# Patient Record
Sex: Female | Born: 1975 | Race: White | Hispanic: No | Marital: Married | Smoking: Current every day smoker
Health system: Southern US, Community
[De-identification: ages and names within clinical notes are randomized; demographics above are authoritative.]

## PROBLEM LIST (undated history)

## (undated) DIAGNOSIS — R51 Headache: Secondary | ICD-10-CM

## (undated) DIAGNOSIS — R569 Unspecified convulsions: Secondary | ICD-10-CM

## (undated) HISTORY — PX: BACK SURGERY: SHX140

## (undated) HISTORY — DX: Unspecified convulsions: R56.9

## (undated) HISTORY — PX: CHOLECYSTECTOMY: SHX55

## (undated) HISTORY — DX: Headache: R51

## (undated) HISTORY — PX: TUBAL LIGATION: SHX77

---

## 2009-06-11 ENCOUNTER — Emergency Department (HOSPITAL_COMMUNITY): Admission: EM | Admit: 2009-06-11 | Discharge: 2009-06-11 | Payer: Self-pay | Admitting: Emergency Medicine

## 2013-12-23 ENCOUNTER — Encounter (HOSPITAL_COMMUNITY): Payer: Self-pay | Admitting: Emergency Medicine

## 2013-12-23 ENCOUNTER — Emergency Department (HOSPITAL_COMMUNITY)
Admission: EM | Admit: 2013-12-23 | Discharge: 2013-12-23 | Disposition: A | Discharge status: Home / Self Care | Payer: Medicaid Other | Attending: Emergency Medicine | Admitting: Emergency Medicine

## 2013-12-23 ENCOUNTER — Emergency Department (HOSPITAL_COMMUNITY): Payer: Medicaid Other

## 2013-12-23 DIAGNOSIS — R51 Headache: Secondary | ICD-10-CM | POA: Insufficient documentation

## 2013-12-23 DIAGNOSIS — Z9889 Other specified postprocedural states: Secondary | ICD-10-CM | POA: Diagnosis not present

## 2013-12-23 DIAGNOSIS — R1013 Epigastric pain: Secondary | ICD-10-CM | POA: Insufficient documentation

## 2013-12-23 DIAGNOSIS — Z9851 Tubal ligation status: Secondary | ICD-10-CM | POA: Diagnosis not present

## 2013-12-23 DIAGNOSIS — F17200 Nicotine dependence, unspecified, uncomplicated: Secondary | ICD-10-CM | POA: Diagnosis not present

## 2013-12-23 DIAGNOSIS — Z3202 Encounter for pregnancy test, result negative: Secondary | ICD-10-CM | POA: Insufficient documentation

## 2013-12-23 DIAGNOSIS — R079 Chest pain, unspecified: Secondary | ICD-10-CM | POA: Insufficient documentation

## 2013-12-23 LAB — HEPATIC FUNCTION PANEL
ALBUMIN: 4.4 g/dL (ref 3.5–5.2)
ALT: 20 U/L (ref 0–35)
AST: 19 U/L (ref 0–37)
Alkaline Phosphatase: 86 U/L (ref 39–117)
BILIRUBIN TOTAL: 0.3 mg/dL (ref 0.3–1.2)
Total Protein: 8.6 g/dL — ABNORMAL HIGH (ref 6.0–8.3)

## 2013-12-23 LAB — BASIC METABOLIC PANEL
Anion gap: 16 — ABNORMAL HIGH (ref 5–15)
BUN: 7 mg/dL (ref 6–23)
CHLORIDE: 104 meq/L (ref 96–112)
CO2: 22 meq/L (ref 19–32)
CREATININE: 0.71 mg/dL (ref 0.50–1.10)
Calcium: 9.6 mg/dL (ref 8.4–10.5)
GFR calc non Af Amer: >90 mL/min (ref >90)
GLUCOSE: 87 mg/dL (ref 70–99)
POTASSIUM: 4.3 meq/L (ref 3.7–5.3)
Sodium: 142 mEq/L (ref 137–147)

## 2013-12-23 LAB — CBC
HEMATOCRIT: 46.8 % — AB (ref 36.0–46.0)
HEMOGLOBIN: 16.3 g/dL — AB (ref 12.0–15.0)
MCH: 32.5 pg (ref 26.0–34.0)
MCHC: 34.8 g/dL (ref 30.0–36.0)
MCV: 93.2 fL (ref 78.0–100.0)
Platelets: 188 10*3/uL (ref 150–400)
RBC: 5.02 MIL/uL (ref 3.87–5.11)
RDW: 13.5 % (ref 11.5–15.5)
WBC: 11.5 10*3/uL — ABNORMAL HIGH (ref 4.0–10.5)

## 2013-12-23 LAB — POC URINE PREG, ED: Preg Test, Ur: NEGATIVE

## 2013-12-23 LAB — I-STAT TROPONIN, ED
TROPONIN I, POC: 0 ng/mL (ref 0.00–0.08)
TROPONIN I, POC: 0 ng/mL (ref 0.00–0.08)

## 2013-12-23 LAB — LIPASE, BLOOD: LIPASE: 31 U/L (ref 11–59)

## 2013-12-23 MED ORDER — KETOROLAC TROMETHAMINE 30 MG/ML IJ SOLN
30.0000 mg | Freq: Once | INTRAMUSCULAR | Status: AC
  Administered 2013-12-23: 30 mg via INTRAMUSCULAR
  Filled 2013-12-23: qty 1

## 2013-12-23 MED ORDER — HYDROCODONE-ACETAMINOPHEN 5-325 MG PO TABS: 1.0000 | ORAL_TABLET | Freq: Four times a day (QID) | ORAL | Status: DC | PRN

## 2013-12-23 MED ORDER — IBUPROFEN 800 MG PO TABS: 800.0000 mg | ORAL_TABLET | Freq: Three times a day (TID) | ORAL | Status: DC

## 2013-12-23 MED ORDER — IBUPROFEN 800 MG PO TABS
800.0000 mg | ORAL_TABLET | Freq: Once | ORAL | Status: AC
  Administered 2013-12-23: 800 mg via ORAL
  Filled 2013-12-23: qty 1

## 2013-12-23 NOTE — ED Notes (Signed)
Asked pt for a urine sample but pt states she is unable to void at this time.

## 2013-12-23 NOTE — ED Notes (Signed)
Spoke to lab about BMP delay; reports machine is getting a QC and will then be ran

## 2013-12-23 NOTE — ED Notes (Signed)
Pt is here with chest pain that radiates to left arm and neck for the last week.  Pt reports headache that just started today.

## 2013-12-23 NOTE — ED Notes (Signed)
Discharge instructions given voiced understanding

## 2013-12-23 NOTE — Discharge Instructions (Signed)

## 2013-12-23 NOTE — ED Provider Notes (Signed)
CSN: [id number]     Arrival date & time 12/23/13  1624 History   First MD Initiated Contact with Patient 12/23/13 1709     Chief Complaint  Patient presents with  . Chest Pain  . Headache     (Consider location/radiation/quality/duration/timing/severity/associated sxs/prior Treatment) HPI Comments: Patient presents to the emergency department with chief complaint of chest pain times 1 to 2 weeks. She states the symptoms are intermittent in nature. She denies any associated shortness of breath, diaphoresis, nausea, or vomiting. She has not tried taking anything to alleviate her symptoms. She denies any cardiac history. Cardiac risks factors are smoking only.  No history of PE or DVT, no recent surgery, no hemoptysis, no exogenous estrogen use, no unilateral leg swelling, no recent long travel. She states that when she presses on her abdomen in the upper left quadrant, this relieves the pain. She states that she is concerned about her heart because she has extensive family history of heart disease.   The history is provided by the patient. No language interpreter was used.    History reviewed. No pertinent past medical history. Past Surgical History  Procedure Laterality Date  . Cholecystectomy    . Tubal ligation     No family history on file. History  Substance Use Topics  . Smoking status: Current Every Day Smoker  . Smokeless tobacco: Not on file  . Alcohol Use: No   OB History   Grav Para Term Preterm Abortions TAB SAB Ect Mult Living                 Review of Systems  Constitutional: Negative for fever and chills.  Respiratory: Negative for shortness of breath.   Cardiovascular: Positive for chest pain.  Gastrointestinal: Negative for nausea, vomiting, diarrhea and constipation.  Genitourinary: Negative for dysuria.  All other systems reviewed and are negative.     Allergies  Review of patient's allergies indicates no known allergies.  Home Medications   Prior  to Admission medications   Not on File   BP 133/81  Pulse 91  Temp(Src) 98.4 F (36.9 C) (Oral)  Resp 18  SpO2 99%  LMP 11/23/2013 Physical Exam  Nursing note and vitals reviewed. Constitutional: She is oriented to person, place, and time. She appears well-developed and well-nourished.  HENT:  Head: Normocephalic and atraumatic.  Eyes: Conjunctivae and EOM are normal. Pupils are equal, round, and reactive to light.  Neck: Normal range of motion. Neck supple.  Cardiovascular: Normal rate and regular rhythm.  Exam reveals no gallop and no friction rub.   No murmur heard. Pulmonary/Chest: Effort normal and breath sounds normal. No respiratory distress. She has no wheezes. She has no rales. She exhibits no tenderness.  Clear to auscultation bilaterally  Abdominal: Soft. Bowel sounds are normal. She exhibits no distension and no mass. There is no tenderness. There is no rebound and no guarding.  Abdomen is moderately tender to palpation in the epigastrium, no other focal abdominal tenderness  Musculoskeletal: Normal range of motion. She exhibits no edema and no tenderness.  Neurological: She is alert and oriented to person, place, and time.  Skin: Skin is warm and dry.  Psychiatric: She has a normal mood and affect. Her behavior is normal. Judgment and thought content normal.    ED Course  Procedures (including critical care time) Results for orders placed during the hospital encounter of 12/23/13  CBC      Result Value Ref Range   WBC 11.5 (*) 4.0 -  10.5 K/uL   RBC 5.02  3.87 - 5.11 MIL/uL   Hemoglobin 16.3 (*) 12.0 - 15.0 g/dL   HCT 16.1 (*) 09.6 - 04.5 %   MCV 93.2  78.0 - 100.0 fL   MCH 32.5  26.0 - 34.0 pg   MCHC 34.8  30.0 - 36.0 g/dL   RDW 40.9  81.1 - 91.4 %   Platelets 188  150 - 400 K/uL  BASIC METABOLIC PANEL      Result Value Ref Range   Sodium 142  137 - 147 mEq/L   Potassium 4.3  3.7 - 5.3 mEq/L   Chloride 104  96 - 112 mEq/L   CO2 22  19 - 32 mEq/L    Glucose, Bld 87  70 - 99 mg/dL   BUN 7  6 - 23 mg/dL   Creatinine, Ser 7.82  0.50 - 1.10 mg/dL   Calcium 9.6  8.4 - 95.6 mg/dL   GFR calc non Af Amer >90  >90 mL/min   GFR calc Af Amer >90  >90 mL/min   Anion gap 16 (*) 5 - 15  LIPASE, BLOOD      Result Value Ref Range   Lipase 31  11 - 59 U/L  HEPATIC FUNCTION PANEL      Result Value Ref Range   Total Protein 8.6 (*) 6.0 - 8.3 g/dL   Albumin 4.4  3.5 - 5.2 g/dL   AST 19  0 - 37 U/L   ALT 20  0 - 35 U/L   Alkaline Phosphatase 86  39 - 117 U/L   Total Bilirubin 0.3  0.3 - 1.2 mg/dL   Bilirubin, Direct <2.1  0.0 - 0.3 mg/dL   Indirect Bilirubin NOT CALCULATED  0.3 - 0.9 mg/dL  I-STAT TROPOININ, ED      Result Value Ref Range   Troponin i, poc 0.00  0.00 - 0.08 ng/mL   Comment 3           POC URINE PREG, ED      Result Value Ref Range   Preg Test, Ur NEGATIVE  NEGATIVE  I-STAT TROPOININ, ED      Result Value Ref Range   Troponin i, poc 0.00  0.00 - 0.08 ng/mL   Comment 3            Dg Chest 2 View  12/23/2013   CLINICAL DATA:  Sharp chest pain and chest pressure.  EXAM: CHEST  2 VIEW  COMPARISON:  Chest x-ray 06/23/2010.  FINDINGS: Lung volumes are normal. No consolidative airspace disease. No pleural effusions. No pneumothorax. No pulmonary nodule or mass noted. Pulmonary vasculature and the cardiomediastinal silhouette are within normal limits. Azygos lobe (normal anatomical variant) incidentally noted. Surgical clips project over the right upper quadrant of the abdomen, likely from prior cholecystectomy.  IMPRESSION: No radiographic evidence of acute cardiopulmonary disease.   Electronically Signed   By: Trudie Reed M.D.   On: 12/23/2013 17:09     Imaging Review Dg Chest 2 View  12/23/2013   CLINICAL DATA:  Sharp chest pain and chest pressure.  EXAM: CHEST  2 VIEW  COMPARISON:  Chest x-ray 06/23/2010.  FINDINGS: Lung volumes are normal. No consolidative airspace disease. No pleural effusions. No pneumothorax. No pulmonary  nodule or mass noted. Pulmonary vasculature and the cardiomediastinal silhouette are within normal limits. Azygos lobe (normal anatomical variant) incidentally noted. Surgical clips project over the right upper quadrant of the abdomen, likely from prior cholecystectomy.  IMPRESSION:  No radiographic evidence of acute cardiopulmonary disease.   Electronically Signed   By: Trudie Reedaniel  Entrikin M.D.   On: 12/23/2013 17:09     EKG Interpretation   Date/Time:  Sunday December 23 2013 16:35:00 EDT Ventricular Rate:  71 PR Interval:  152 QRS Duration: 86 QT Interval:  418 QTC Calculation: 454 R Axis:   33 Text Interpretation:  Normal sinus rhythm Cannot rule out Inferior infarct  , age undetermined Abnormal ECG Similar to prior Confirmed by [facility]  MD,  BLAIR ([id number]) on 12/23/2013 6:09:12 PM      MDM   Final diagnoses:  Chest pain, unspecified chest pain type    Patient with chest pain x1-2 weeks.  Doubt ACS.  HEART score is 0.  PERC negative, doubt PE. No history of PE or DVT, no recent surgery, no hemoptysis, no exogenous estrogen use, no unilateral leg swelling, no recent long travel.  Pain has been intermittent.  It improves with palpation of the LUQ, and abdomen is TTP in the epigastric region.  Consider pancreatitis or GERD.  Will check abdominal labs and reassess.  Anticipate discharge to home.  7:01 PM Labs are reassuring.  Will check delta troponin.   8:44 PM Delta troponin is negative.  DC to home with NSAIDs and some pain medicine.  Recommend PCP follow-up.  Patient discussed with Dr. Gwendolyn GrantWalden, who agrees with the plan.  Return precautions given.  Filed Vitals:   12/23/13 1915  BP: 128/82  Pulse: 89  Temp:   Resp: [address]      Helma Argyle, PA-C 12/23/13 2046

## 2013-12-23 NOTE — ED Notes (Signed)
Rob, PA at bedside. 

## 2013-12-24 NOTE — ED Provider Notes (Signed)
Medical screening examination/treatment/procedure(s) were performed by non-physician practitioner and as supervising physician I was immediately available for consultation/collaboration.   EKG Interpretation   Date/Time:  Sunday December 23 2013 16:35:00 EDT Ventricular Rate:  71 PR Interval:  152 QRS Duration: 86 QT Interval:  418 QTC Calculation: 454 R Axis:   33 Text Interpretation:  Normal sinus rhythm Cannot rule out Inferior infarct  , age undetermined Abnormal ECG Similar to prior Confirmed by Gwendolyn GrantWALDEN  MD,  Ryin Schillo ([id number]) on 12/23/2013 6:09:12 PM        Martha HaitWilliam Berthel Bagnall, MD 12/24/13 [phone]

## 2014-04-16 ENCOUNTER — Emergency Department (HOSPITAL_COMMUNITY): Payer: Medicaid Other

## 2014-04-16 ENCOUNTER — Inpatient Hospital Stay (HOSPITAL_COMMUNITY)
Admission: EM | Admit: 2014-04-16 | Discharge: 2014-04-18 | DRG: 101 | Disposition: A | Discharge status: Home / Self Care | Payer: Medicaid Other | Attending: Internal Medicine | Admitting: Internal Medicine

## 2014-04-16 ENCOUNTER — Encounter (HOSPITAL_COMMUNITY): Payer: Self-pay | Admitting: Emergency Medicine

## 2014-04-16 DIAGNOSIS — Z79899 Other long term (current) drug therapy: Secondary | ICD-10-CM

## 2014-04-16 DIAGNOSIS — E876 Hypokalemia: Secondary | ICD-10-CM

## 2014-04-16 DIAGNOSIS — R569 Unspecified convulsions: Principal | ICD-10-CM | POA: Diagnosis present

## 2014-04-16 DIAGNOSIS — E872 Acidosis, unspecified: Secondary | ICD-10-CM | POA: Diagnosis present

## 2014-04-16 DIAGNOSIS — F17210 Nicotine dependence, cigarettes, uncomplicated: Secondary | ICD-10-CM | POA: Diagnosis present

## 2014-04-16 DIAGNOSIS — Z9049 Acquired absence of other specified parts of digestive tract: Secondary | ICD-10-CM | POA: Diagnosis present

## 2014-04-16 DIAGNOSIS — R112 Nausea with vomiting, unspecified: Secondary | ICD-10-CM | POA: Diagnosis present

## 2014-04-16 DIAGNOSIS — F17200 Nicotine dependence, unspecified, uncomplicated: Secondary | ICD-10-CM | POA: Diagnosis present

## 2014-04-16 DIAGNOSIS — R4182 Altered mental status, unspecified: Secondary | ICD-10-CM

## 2014-04-16 DIAGNOSIS — W19XXXA Unspecified fall, initial encounter: Secondary | ICD-10-CM

## 2014-04-16 DIAGNOSIS — D72829 Elevated white blood cell count, unspecified: Secondary | ICD-10-CM | POA: Diagnosis present

## 2014-04-16 LAB — CBC WITH DIFFERENTIAL/PLATELET
Basophils Absolute: 0.1 10*3/uL (ref 0.0–0.1)
Basophils Relative: 0 % (ref 0–1)
Eosinophils Absolute: 0.1 10*3/uL (ref 0.0–0.7)
Eosinophils Relative: 1 % (ref 0–5)
HCT: 44.9 % (ref 36.0–46.0)
Hemoglobin: 15.5 g/dL — ABNORMAL HIGH (ref 12.0–15.0)
Lymphocytes Relative: 28 % (ref 12–46)
Lymphs Abs: 5.8 10*3/uL — ABNORMAL HIGH (ref 0.7–4.0)
MCH: 31.8 pg (ref 26.0–34.0)
MCHC: 34.5 g/dL (ref 30.0–36.0)
MCV: 92 fL (ref 78.0–100.0)
Monocytes Absolute: 1.3 10*3/uL — ABNORMAL HIGH (ref 0.1–1.0)
Monocytes Relative: 6 % (ref 3–12)
Neutro Abs: 13.3 10*3/uL — ABNORMAL HIGH (ref 1.7–7.7)
Neutrophils Relative %: 65 % (ref 43–77)
Platelets: 236 10*3/uL (ref 150–400)
RBC: 4.88 MIL/uL (ref 3.87–5.11)
RDW: 12.8 % (ref 11.5–15.5)
WBC: 20.5 10*3/uL — ABNORMAL HIGH (ref 4.0–10.5)

## 2014-04-16 LAB — URINALYSIS, ROUTINE W REFLEX MICROSCOPIC
Bilirubin Urine: NEGATIVE
GLUCOSE, UA: NEGATIVE mg/dL
KETONES UR: NEGATIVE mg/dL
LEUKOCYTES UA: NEGATIVE
Nitrite: NEGATIVE
Protein, ur: 30 mg/dL — AB
SPECIFIC GRAVITY, URINE: 1.025 (ref 1.005–1.030)
Urobilinogen, UA: 0.2 mg/dL (ref 0.0–1.0)
pH: 5.5 (ref 5.0–8.0)

## 2014-04-16 LAB — CBG MONITORING, ED: GLUCOSE-CAPILLARY: 140 mg/dL — AB (ref 70–99)

## 2014-04-16 LAB — COMPREHENSIVE METABOLIC PANEL
ALT: 16 U/L (ref 0–35)
ANION GAP: 31 — AB (ref 5–15)
AST: 17 U/L (ref 0–37)
Albumin: 4.2 g/dL (ref 3.5–5.2)
Alkaline Phosphatase: 73 U/L (ref 39–117)
BILIRUBIN TOTAL: 0.4 mg/dL (ref 0.3–1.2)
BUN: 5 mg/dL — AB (ref 6–23)
CALCIUM: 9.3 mg/dL (ref 8.4–10.5)
CO2: 7 meq/L — AB (ref 19–32)
CREATININE: 0.86 mg/dL (ref 0.50–1.10)
Chloride: 101 mEq/L (ref 96–112)
GFR, EST NON AFRICAN AMERICAN: 85 mL/min — AB (ref >90)
GLUCOSE: 136 mg/dL — AB (ref 70–99)
Potassium: 3.3 mEq/L — ABNORMAL LOW (ref 3.7–5.3)
Sodium: 139 mEq/L (ref 137–147)
Total Protein: 8.4 g/dL — ABNORMAL HIGH (ref 6.0–8.3)

## 2014-04-16 LAB — I-STAT BETA HCG BLOOD, ED (MC, WL, AP ONLY)

## 2014-04-16 LAB — URINE MICROSCOPIC-ADD ON

## 2014-04-16 LAB — ETHANOL: Alcohol, Ethyl (B): <11 mg/dL (ref 0–11)

## 2014-04-16 LAB — RAPID URINE DRUG SCREEN, HOSP PERFORMED
Amphetamines: NOT DETECTED
BARBITURATES: NOT DETECTED
Benzodiazepines: NOT DETECTED
Cocaine: NOT DETECTED
OPIATES: NOT DETECTED
TETRAHYDROCANNABINOL: NOT DETECTED

## 2014-04-16 LAB — I-STAT CG4 LACTIC ACID, ED: LACTIC ACID, VENOUS: 15.47 mmol/L — AB (ref 0.5–2.2)

## 2014-04-16 MED ORDER — LORAZEPAM 2 MG/ML IJ SOLN
1.0000 mg | Freq: Once | INTRAMUSCULAR | Status: AC
  Administered 2014-04-16: 1 mg via INTRAVENOUS

## 2014-04-16 MED ORDER — SODIUM CHLORIDE 0.9 % IV BOLUS (SEPSIS)
1000.0000 mL | Freq: Once | INTRAVENOUS | Status: AC
  Administered 2014-04-16: 1000 mL via INTRAVENOUS

## 2014-04-16 MED ORDER — HALOPERIDOL LACTATE 5 MG/ML IJ SOLN
5.0000 mg | Freq: Once | INTRAMUSCULAR | Status: AC
  Administered 2014-04-16: 5 mg via INTRAVENOUS
  Filled 2014-04-16: qty 1

## 2014-04-16 NOTE — ED Notes (Signed)
Pt actively vomiting, responsive to pain.

## 2014-04-16 NOTE — ED Notes (Signed)
Pt returned from CT by staff.

## 2014-04-16 NOTE — Consult Note (Signed)
Consult Reason for Consult: seizure activity Referring Physician: Dr Ladona Ridgel [facility] ED  CC: seizure activity  HPI: Martha Chen is an 38 y.o. female presenting with new onset seizure x 2. Patient reports having headache earlier today for which she took a percocet. Shortly after noted to have LOC, hit head and had what appears to be tonic-clonic movement of bilateral UE. Lasted < 2 minutes. On EMS arrival was back to baseline, normal blood glucose. On arrival to ED had another episode described as shaking of bilateral upper and lower extremities. ED physician notes patient was non-verbal but would appear to regard/make eye contact during this episode. Lasted around 1-2 minutes. Given ativan after and then haldol for agitation.   Of note, patient reports being able to hear people during these episodes but was unable to respond. Her daughter notes the patient did briefly talk to her during the initial episode while having generalized movements. Patient reports being under increased stress recently.   Labs pertinent for WBC 20.5, lactic acid 15.47, CO2 of 7.   History mainly via ED physician and patients family.   History reviewed. No pertinent past medical history.  Past Surgical History  Procedure Laterality Date  . Cholecystectomy    . Tubal ligation      No family history on file.  Social History:  reports that she has been smoking.  She does not have any smokeless tobacco history on file. She reports that she does not drink alcohol or use illicit drugs.  No Known Allergies  Medications: Prior to Admission:  (Not in a hospital admission)  Head CT reviewed and was unremarkable.   ROS: Out of a complete 14 system review, the patient complains of only the following symptoms, and all other reviewed systems are negative. + fatigue, headache  Physical Examination: Blood pressure 115/63, pulse 79, resp. rate 24, SpO2 100 %.  Neurologic Examination Mental Status: Lethargic but easily  aroused, oriented to name, date, location, thought content appropriate.  Speech fluent without evidence of aphasia.  Able to follow 3 step commands without difficulty. Cranial Nerves: II: unable to visualize fundi due to pupil size, visual fields grossly normal, pupils equal, round, reactive to light and accommodation III,IV, VI: ptosis not present, extra-ocular motions intact bilaterally V,VII: smile symmetric, facial light touch sensation normal bilaterally VIII: hearing normal bilaterally IX,X: gag reflex present XI: unable to test due to cervical collar in place XII: tongue strength normal  Motor: Right : Upper extremity    Left:     Upper extremity 5/5 deltoid       5/5 deltoid 5/5 biceps      5/5 biceps  5/5 triceps      5/5 triceps 5/5 hand grip      5/5 hand grip  Lower extremity     Lower extremity 5/5 hip flexor      5/5 hip flexor 5/5 quadricep      5/5 quadriceps  5/5 hamstrings     5/5 hamstrings 5/5 plantar flexion       5/5 plantar flexion 5/5 plantar extension     5/5 plantar extension Tone and bulk:normal tone throughout; no atrophy noted Sensory: Pinprick and light touch intact throughout, bilaterally Deep Tendon Reflexes: 2+ and symmetric throughout, 1+ AJs bilaterally Plantars: Right: downgoing   Left: downgoing Cerebellar: normal finger-to-nose, normal rapid alternating movements and normal heel-to-shin test Gait: deferred due to C collar in place  Laboratory Studies:   Basic Metabolic Panel:  Recent Labs Lab 04/16/14 2058  NA 139  K 3.3*  CL 101  CO2 7*  GLUCOSE 136*  BUN 5*  CREATININE 0.86  CALCIUM 9.3    Liver Function Tests:  Recent Labs Lab 04/16/14 2058  AST 17  ALT 16  ALKPHOS 73  BILITOT 0.4  PROT 8.4*  ALBUMIN 4.2   No results for input(s): LIPASE, AMYLASE in the last 168 hours. No results for input(s): AMMONIA in the last 168 hours.  CBC:  Recent Labs Lab 04/16/14 2058  WBC 20.5*  NEUTROABS 13.3*  HGB 15.5*  HCT 44.9   MCV 92.0  PLT 236    Cardiac Enzymes: No results for input(s): CKTOTAL, CKMB, CKMBINDEX, TROPONINI in the last 168 hours.  BNP: Invalid input(s): POCBNP  CBG:  Recent Labs Lab 04/16/14 2046  GLUCAP 140*    Microbiology: No results found for this or any previous visit.  Coagulation Studies: No results for input(s): LABPROT, INR in the last 72 hours.  Urinalysis:  Recent Labs Lab 04/16/14 2130  COLORURINE YELLOW  LABSPEC 1.025  PHURINE 5.5  GLUCOSEU NEGATIVE  HGBUR MODERATE*  BILIRUBINUR NEGATIVE  KETONESUR NEGATIVE  PROTEINUR 30*  UROBILINOGEN 0.2  NITRITE NEGATIVE  LEUKOCYTESUR NEGATIVE    Lipid Panel:  No results found for: CHOL, TRIG, HDL, CHOLHDL, VLDL, LDLCALC  HgbA1C: No results found for: HGBA1C  Urine Drug Screen:     Component Value Date/Time   LABOPIA NONE DETECTED 04/16/2014 2130   COCAINSCRNUR NONE DETECTED 04/16/2014 2130   LABBENZ NONE DETECTED 04/16/2014 2130   AMPHETMU NONE DETECTED 04/16/2014 2130   THCU NONE DETECTED 04/16/2014 2130   LABBARB NONE DETECTED 04/16/2014 2130    Alcohol Level:  Recent Labs Lab 04/16/14 2058  ETH <11     Imaging: Ct Head Wo Contrast  04/16/2014   CLINICAL DATA:  Seizure-like activity. No history of seizures. Fall.  EXAM: CT HEAD WITHOUT CONTRAST  CT CERVICAL SPINE WITHOUT CONTRAST  TECHNIQUE: Multidetector CT imaging of the head and cervical spine was performed following the standard protocol without intravenous contrast. Multiplanar CT image reconstructions of the cervical spine were also generated.  COMPARISON:  CT head 10/12/2009. Cervical spine radiographs 06/11/2009.  FINDINGS: CT HEAD FINDINGS  Ventricles and sulci appear symmetrical. No mass effect or midline shift. No abnormal extra-axial fluid collections. Gray-white matter junctions are distinct. Basal cisterns are not effaced. No evidence of acute intracranial hemorrhage. No depressed skull fractures. Mucosal thickening in the left maxillary  antrum. Mastoid air cells are not opacified.  CT CERVICAL SPINE FINDINGS  Normal alignment of the cervical spine and facet joints. No vertebral compression deformities. Intervertebral disc space heights are preserved. Minimal degenerative endplate hypertrophic changes. Mild degenerative changes at C1 to. No prevertebral soft tissue swelling. C1-2 articulation appears intact. No focal bone lesion or bone destruction. Bone cortex and trabecular architecture appear intact. Soft tissues are unremarkable. Azygos lobe demonstrated in the right lung apex.  IMPRESSION: No acute intracranial abnormalities.  Normal alignment of the cervical spine. No displaced fractures identified.   Electronically Signed   By: Burman NievesWilliam  Stevens M.D.   On: 04/16/2014 21:37   Ct Cervical Spine Wo Contrast  04/16/2014   CLINICAL DATA:  Seizure-like activity. No history of seizures. Fall.  EXAM: CT HEAD WITHOUT CONTRAST  CT CERVICAL SPINE WITHOUT CONTRAST  TECHNIQUE: Multidetector CT imaging of the head and cervical spine was performed following the standard protocol without intravenous contrast. Multiplanar CT image reconstructions of the cervical spine were also generated.  COMPARISON:  CT head  10/12/2009. Cervical spine radiographs 06/11/2009.  FINDINGS: CT HEAD FINDINGS  Ventricles and sulci appear symmetrical. No mass effect or midline shift. No abnormal extra-axial fluid collections. Gray-white matter junctions are distinct. Basal cisterns are not effaced. No evidence of acute intracranial hemorrhage. No depressed skull fractures. Mucosal thickening in the left maxillary antrum. Mastoid air cells are not opacified.  CT CERVICAL SPINE FINDINGS  Normal alignment of the cervical spine and facet joints. No vertebral compression deformities. Intervertebral disc space heights are preserved. Minimal degenerative endplate hypertrophic changes. Mild degenerative changes at C1 to. No prevertebral soft tissue swelling. C1-2 articulation appears  intact. No focal bone lesion or bone destruction. Bone cortex and trabecular architecture appear intact. Soft tissues are unremarkable. Azygos lobe demonstrated in the right lung apex.  IMPRESSION: No acute intracranial abnormalities.  Normal alignment of the cervical spine. No displaced fractures identified.   Electronically Signed   By: Burman NievesWilliam  Stevens M.D.   On: 04/16/2014 21:37   Dg Chest Portable 1 View  04/16/2014   CLINICAL DATA:  Altered mental status. Patient lost consciousness today and fell to the ground. Smoker.  EXAM: PORTABLE CHEST - 1 VIEW  COMPARISON:  12/23/2013  FINDINGS: Shallow inspiration. The heart size and mediastinal contours are within normal limits. Both lungs are clear. The visualized skeletal structures are unremarkable.  IMPRESSION: No active disease.   Electronically Signed   By: Burman NievesWilliam  Stevens M.D.   On: 04/16/2014 22:13     Assessment/Plan:  38y/o woman presenting for episodes concerning for seizure activity. No prior seizure activity. Clinical description raises question of possible non-epileptic seizure but she does have markedly elevated WBC and lactic acid level. Will admit for observation and further workup. Will hold off on AED pending further workup.  -EEG -MRI brain  -seizure precautions -ativan prn for subsequent seizure   Elspeth Choeter Angelisse Riso, DO [facility] [phone]  If 7pm- 7am, please page neurology on call as listed in AMION. 04/16/2014, 11:30 PM

## 2014-04-16 NOTE — ED Notes (Signed)
Neuro at bedside.

## 2014-04-16 NOTE — ED Notes (Signed)
Elevated CG-4 of 15.47 reported to Dr. Blinda LeatherwoodPollina

## 2014-04-16 NOTE — ED Provider Notes (Signed)
CSN: [id number]     Arrival date & time 04/16/14  2030 History   First MD Initiated Contact with Patient 04/16/14 2053     Chief Complaint  Patient presents with  . Seizures     (Consider location/radiation/quality/duration/timing/severity/associated sxs/prior Treatment) HPI Comments: 38 y/o female with no known seizure history presenting with seizure like activity. Pt reported having HA earlier today to family and took percocet. She was then reported to have generalized shaking episode. On EMS arrival, she was AAO with normal CBG. She began having shaking episode on arrival to ED. She will not answer questions on arrival, is flailing all extremities, but will make eye contact.   The history is provided by the EMS personnel and a relative. No language interpreter was used.    History reviewed. No pertinent past medical history. Past Surgical History  Procedure Laterality Date  . Cholecystectomy    . Tubal ligation     No family history on file. History  Substance Use Topics  . Smoking status: Current Every Day Smoker  . Smokeless tobacco: Not on file  . Alcohol Use: No   OB History    No data available     Review of Systems  Unable to perform ROS: Mental status change  Neurological: Positive for seizures.      Allergies  Review of patient's allergies indicates no known allergies.  Home Medications   Prior to Admission medications   Medication Sig Start Date End Date Taking? Authorizing Provider  HYDROcodone-acetaminophen (NORCO/VICODIN) 5-325 MG per tablet Take 1 tablet by mouth every 6 (six) hours as needed. 12/23/13   Roxy Horsemanobert Browning, PA-C  ibuprofen (ADVIL,MOTRIN) 800 MG tablet Take 1 tablet (800 mg total) by mouth 3 (three) times daily. 12/23/13   Roxy Horsemanobert Browning, PA-C   BP 119/78 mmHg  Resp 32  SpO2 95% Physical Exam  Constitutional: She appears well-developed and well-nourished.  HENT:  Head: Normocephalic and atraumatic.  Mouth/Throat: Oropharynx is clear  and moist.  Eyes: EOM are normal. Pupils are equal, round, and reactive to light.  Cardiovascular: Regular rhythm and normal heart sounds.  Tachycardia present.   Pulmonary/Chest: Breath sounds normal.  Abdominal: Soft. She exhibits no distension.  Neurological: She is alert. She has normal strength. She is disoriented. No cranial nerve deficit or sensory deficit. GCS eye subscore is 4. GCS verbal subscore is 4. GCS motor subscore is 6.  Reflex Scores:      Patellar reflexes are 2+ on the right side and 2+ on the left side.      Achilles reflexes are 2+ on the right side and 2+ on the left side. Vitals reviewed.   ED Course  Procedures (including critical care time) Labs Review Labs Reviewed  CBC WITH DIFFERENTIAL - Abnormal; Notable for the following:    WBC 20.5 (*)    Hemoglobin 15.5 (*)    Neutro Abs 13.3 (*)    Lymphs Abs 5.8 (*)    Monocytes Absolute 1.3 (*)    All other components within normal limits  COMPREHENSIVE METABOLIC PANEL - Abnormal; Notable for the following:    Potassium 3.3 (*)    CO2 7 (*)    Glucose, Bld 136 (*)    BUN 5 (*)    Total Protein 8.4 (*)    GFR calc non Af Amer 85 (*)    Anion gap 31 (*)    All other components within normal limits  URINALYSIS, ROUTINE W REFLEX MICROSCOPIC - Abnormal; Notable for the following:  Hgb urine dipstick MODERATE (*)    Protein, ur 30 (*)    All other components within normal limits  URINE MICROSCOPIC-ADD ON - Abnormal; Notable for the following:    Casts HYALINE CASTS (*)    All other components within normal limits  COMPREHENSIVE METABOLIC PANEL - Abnormal; Notable for the following:    Potassium 3.4 (*)    CO2 17 (*)    Glucose, Bld 110 (*)    BUN 5 (*)    GFR calc non Af Amer 88 (*)    All other components within normal limits  CBC - Abnormal; Notable for the following:    WBC 19.9 (*)    All other components within normal limits  I-STAT CG4 LACTIC ACID, ED - Abnormal; Notable for the following:     Lactic Acid, Venous 15.47 (*)    All other components within normal limits  CBG MONITORING, ED - Abnormal; Notable for the following:    Glucose-Capillary 140 (*)    All other components within normal limits  MRSA PCR SCREENING  CULTURE, BLOOD (ROUTINE X 2)  CULTURE, BLOOD (ROUTINE X 2)  ETHANOL  URINE RAPID DRUG SCREEN (HOSP PERFORMED)  LIPASE, BLOOD  LACTIC ACID, PLASMA  PROTIME-INR  I-STAT BETA HCG BLOOD, ED ([facility], [facility], [facility] ONLY)    Imaging Review Ct Head Wo Contrast  04/16/2014   CLINICAL DATA:  Seizure-like activity. No history of seizures. Fall.  EXAM: CT HEAD WITHOUT CONTRAST  CT CERVICAL SPINE WITHOUT CONTRAST  TECHNIQUE: Multidetector CT imaging of the head and cervical spine was performed following the standard protocol without intravenous contrast. Multiplanar CT image reconstructions of the cervical spine were also generated.  COMPARISON:  CT head 10/12/2009. Cervical spine radiographs 06/11/2009.  FINDINGS: CT HEAD FINDINGS  Ventricles and sulci appear symmetrical. No mass effect or midline shift. No abnormal extra-axial fluid collections. Gray-white matter junctions are distinct. Basal cisterns are not effaced. No evidence of acute intracranial hemorrhage. No depressed skull fractures. Mucosal thickening in the left maxillary antrum. Mastoid air cells are not opacified.  CT CERVICAL SPINE FINDINGS  Normal alignment of the cervical spine and facet joints. No vertebral compression deformities. Intervertebral disc space heights are preserved. Minimal degenerative endplate hypertrophic changes. Mild degenerative changes at C1 to. No prevertebral soft tissue swelling. C1-2 articulation appears intact. No focal bone lesion or bone destruction. Bone cortex and trabecular architecture appear intact. Soft tissues are unremarkable. Azygos lobe demonstrated in the right lung apex.  IMPRESSION: No acute intracranial abnormalities.  Normal alignment of the cervical spine. No displaced fractures  identified.   Electronically Signed   By: Burman Nieves M.D.   On: 04/16/2014 21:37   Ct Cervical Spine Wo Contrast  04/16/2014   CLINICAL DATA:  Seizure-like activity. No history of seizures. Fall.  EXAM: CT HEAD WITHOUT CONTRAST  CT CERVICAL SPINE WITHOUT CONTRAST  TECHNIQUE: Multidetector CT imaging of the head and cervical spine was performed following the standard protocol without intravenous contrast. Multiplanar CT image reconstructions of the cervical spine were also generated.  COMPARISON:  CT head 10/12/2009. Cervical spine radiographs 06/11/2009.  FINDINGS: CT HEAD FINDINGS  Ventricles and sulci appear symmetrical. No mass effect or midline shift. No abnormal extra-axial fluid collections. Gray-white matter junctions are distinct. Basal cisterns are not effaced. No evidence of acute intracranial hemorrhage. No depressed skull fractures. Mucosal thickening in the left maxillary antrum. Mastoid air cells are not opacified.  CT CERVICAL SPINE FINDINGS  Normal alignment of the cervical spine  and facet joints. No vertebral compression deformities. Intervertebral disc space heights are preserved. Minimal degenerative endplate hypertrophic changes. Mild degenerative changes at C1 to. No prevertebral soft tissue swelling. C1-2 articulation appears intact. No focal bone lesion or bone destruction. Bone cortex and trabecular architecture appear intact. Soft tissues are unremarkable. Azygos lobe demonstrated in the right lung apex.  IMPRESSION: No acute intracranial abnormalities.  Normal alignment of the cervical spine. No displaced fractures identified.   Electronically Signed   By: Burman NievesWilliam  Stevens M.D.   On: 04/16/2014 21:37   Dg Chest Portable 1 View  04/16/2014   CLINICAL DATA:  Altered mental status. Patient lost consciousness today and fell to the ground. Smoker.  EXAM: PORTABLE CHEST - 1 VIEW  COMPARISON:  12/23/2013  FINDINGS: Shallow inspiration. The heart size and mediastinal contours are  within normal limits. Both lungs are clear. The visualized skeletal structures are unremarkable.  IMPRESSION: No active disease.   Electronically Signed   By: Burman NievesWilliam  Stevens M.D.   On: 04/16/2014 22:13     EKG Interpretation   Date/Time:  Tuesday April 16 2014 20:47:02 EST Ventricular Rate:  143 PR Interval:  112 QRS Duration: 95 QT Interval:  297 QTC Calculation: 458 R Axis:   -29 Text Interpretation:  Age not entered, assumed to be  38 years old for  purpose of ECG interpretation Sinus tachycardia Inferior infarct, old  Abnormal lateral Q waves Probable anteroseptal infarct, old No significant  change since last tracing Confirmed by [facility]LL[facility]  MD, CHRISTOPHER [phone]([id number]) on  04/16/2014 8:54:11 PM      MDM   Final diagnoses:  Fall  Altered mental status    38 y/o female with no significant reported PMH presenting with seizure activity. One episode at home, recurrent on arrival. CBG wnl. Rhythmic movement of arms then spread to include legs. Eye blinking, foaming at mouth, and urinary incontinence. 2 mg ativan given with resolution. Pt persistently agitated, given 5 mg haldol after EKG showed normal QTc to facilitate imaging. Protecting airway, no indication for intubation. CT head/cervical spine without acute abnormality. Pt alert, post ictal, able to answer simple questions on return. Denies recent infection. Did fall with episode. Labs remarkable for lactic acidosis 15, WBC 20, CO2 7. Negative UDS, Etoh. Neurology consulted with concern for new onset seizure vs nonepileptic convulsion. No further episodes in ED. Will admit to Hospitalist for MRI and EEG.     Abagail KitchensMegan Tavoris Brisk, MD 04/17/14 1028  Gilda Creasehristopher J. Pollina, MD 04/17/14 1535

## 2014-04-16 NOTE — ED Notes (Addendum)
Pt to ED via EMS for evaluation of seizure like activity witnessed by family- denies hx of seizures. Pt alert and oriented with EMS- upon arrival to ED pt began having seizure like activity- brought into trauma room- EDP at bedside.  Pt flailing all extremities, moaning, incontinent of urine.

## 2014-04-16 NOTE — ED Notes (Signed)
In and out performed for U/A with assistance from Ali LoweJenna Gage, Charity fundraiserN.

## 2014-04-17 ENCOUNTER — Inpatient Hospital Stay (HOSPITAL_COMMUNITY): Payer: Medicaid Other

## 2014-04-17 ENCOUNTER — Encounter (HOSPITAL_COMMUNITY): Payer: Self-pay | Admitting: Internal Medicine

## 2014-04-17 DIAGNOSIS — D72829 Elevated white blood cell count, unspecified: Secondary | ICD-10-CM | POA: Diagnosis present

## 2014-04-17 DIAGNOSIS — R569 Unspecified convulsions: Secondary | ICD-10-CM

## 2014-04-17 DIAGNOSIS — F17210 Nicotine dependence, cigarettes, uncomplicated: Secondary | ICD-10-CM | POA: Diagnosis present

## 2014-04-17 DIAGNOSIS — E876 Hypokalemia: Secondary | ICD-10-CM

## 2014-04-17 DIAGNOSIS — Z9049 Acquired absence of other specified parts of digestive tract: Secondary | ICD-10-CM | POA: Diagnosis present

## 2014-04-17 DIAGNOSIS — E872 Acidosis, unspecified: Secondary | ICD-10-CM | POA: Diagnosis present

## 2014-04-17 DIAGNOSIS — F17200 Nicotine dependence, unspecified, uncomplicated: Secondary | ICD-10-CM | POA: Diagnosis present

## 2014-04-17 DIAGNOSIS — Z720 Tobacco use: Secondary | ICD-10-CM

## 2014-04-17 DIAGNOSIS — R112 Nausea with vomiting, unspecified: Secondary | ICD-10-CM

## 2014-04-17 DIAGNOSIS — Z79899 Other long term (current) drug therapy: Secondary | ICD-10-CM | POA: Diagnosis not present

## 2014-04-17 LAB — MRSA PCR SCREENING: MRSA by PCR: NEGATIVE

## 2014-04-17 LAB — COMPREHENSIVE METABOLIC PANEL
ALBUMIN: 3.8 g/dL (ref 3.5–5.2)
ALK PHOS: 61 U/L (ref 39–117)
ALT: 13 U/L (ref 0–35)
ANION GAP: 15 (ref 5–15)
AST: 14 U/L (ref 0–37)
BUN: 5 mg/dL — AB (ref 6–23)
CHLORIDE: 109 meq/L (ref 96–112)
CO2: 17 meq/L — AB (ref 19–32)
Calcium: 8.8 mg/dL (ref 8.4–10.5)
Creatinine, Ser: 0.84 mg/dL (ref 0.50–1.10)
GFR calc Af Amer: >90 mL/min (ref >90)
GFR, EST NON AFRICAN AMERICAN: 88 mL/min — AB (ref >90)
GLUCOSE: 110 mg/dL — AB (ref 70–99)
POTASSIUM: 3.4 meq/L — AB (ref 3.7–5.3)
Sodium: 141 mEq/L (ref 137–147)
Total Bilirubin: 0.4 mg/dL (ref 0.3–1.2)
Total Protein: 7.1 g/dL (ref 6.0–8.3)

## 2014-04-17 LAB — LIPASE, BLOOD: Lipase: 26 U/L (ref 11–59)

## 2014-04-17 LAB — PROTIME-INR
INR: 1.12 (ref 0.00–1.49)
Prothrombin Time: 14.5 seconds (ref 11.6–15.2)

## 2014-04-17 LAB — CBC
HEMATOCRIT: 38.4 % (ref 36.0–46.0)
Hemoglobin: 13.6 g/dL (ref 12.0–15.0)
MCH: 31.6 pg (ref 26.0–34.0)
MCHC: 35.4 g/dL (ref 30.0–36.0)
MCV: 89.3 fL (ref 78.0–100.0)
Platelets: 182 10*3/uL (ref 150–400)
RBC: 4.3 MIL/uL (ref 3.87–5.11)
RDW: 12.9 % (ref 11.5–15.5)
WBC: 19.9 10*3/uL — ABNORMAL HIGH (ref 4.0–10.5)

## 2014-04-17 LAB — LACTIC ACID, PLASMA: Lactic Acid, Venous: 1.5 mmol/L (ref 0.5–2.2)

## 2014-04-17 MED ORDER — SODIUM CHLORIDE 0.9 % IV SOLN
INTRAVENOUS | Status: DC
  Administered 2014-04-17: 01:00:00 via INTRAVENOUS

## 2014-04-17 MED ORDER — ONDANSETRON HCL 4 MG PO TABS
4.0000 mg | ORAL_TABLET | Freq: Three times a day (TID) | ORAL | Status: DC | PRN
Start: 2014-04-17 — End: 2014-04-18

## 2014-04-17 MED ORDER — HYDROCODONE-ACETAMINOPHEN 5-325 MG PO TABS: 1.0000 | ORAL_TABLET | Freq: Four times a day (QID) | ORAL | Status: DC | PRN

## 2014-04-17 MED ORDER — PANTOPRAZOLE SODIUM 40 MG PO TBEC
40.0000 mg | DELAYED_RELEASE_TABLET | Freq: Every day | ORAL | Status: DC
  Administered 2014-04-18: 40 mg via ORAL
  Filled 2014-04-17: qty 1

## 2014-04-17 MED ORDER — SODIUM CHLORIDE 0.9 % IV BOLUS (SEPSIS)
1000.0000 mL | Freq: Once | INTRAVENOUS | Status: AC
  Administered 2014-04-17: 1000 mL via INTRAVENOUS

## 2014-04-17 MED ORDER — LEVETIRACETAM IN NACL 500 MG/100ML IV SOLN
500.0000 mg | Freq: Two times a day (BID) | INTRAVENOUS | Status: DC
  Administered 2014-04-17 – 2014-04-18 (×2): 500 mg via INTRAVENOUS
  Filled 2014-04-17 (×4): qty 100

## 2014-04-17 MED ORDER — HEPARIN SODIUM (PORCINE) 5000 UNIT/ML IJ SOLN
5000.0000 [IU] | Freq: Three times a day (TID) | INTRAMUSCULAR | Status: DC
  Administered 2014-04-17 – 2014-04-18 (×4): 5000 [IU] via SUBCUTANEOUS
  Filled 2014-04-17 (×6): qty 1

## 2014-04-17 MED ORDER — ACETAMINOPHEN 325 MG PO TABS
650.0000 mg | ORAL_TABLET | Freq: Four times a day (QID) | ORAL | Status: DC | PRN
  Administered 2014-04-17: 650 mg via ORAL
  Filled 2014-04-17: qty 2

## 2014-04-17 MED ORDER — LORAZEPAM 2 MG/ML IJ SOLN: 1.0000 mg | INTRAMUSCULAR | Status: DC | PRN

## 2014-04-17 MED ORDER — SODIUM CHLORIDE 0.9 % IJ SOLN: 3.0000 mL | Freq: Two times a day (BID) | INTRAMUSCULAR | Status: DC

## 2014-04-17 MED ORDER — INFLUENZA VAC SPLIT QUAD 0.5 ML IM SUSY
0.5000 mL | PREFILLED_SYRINGE | INTRAMUSCULAR | Status: AC
Start: 2014-04-18 — End: 2014-04-18
  Administered 2014-04-18: 0.5 mL via INTRAMUSCULAR
  Filled 2014-04-17: qty 0.5

## 2014-04-17 MED ORDER — POTASSIUM CHLORIDE CRYS ER 20 MEQ PO TBCR: 40.0000 meq | EXTENDED_RELEASE_TABLET | Freq: Once | ORAL | Status: DC

## 2014-04-17 NOTE — Procedures (Signed)
ELECTROENCEPHALOGRAM REPORT  Patient: Martha Chen       Room #: 208 095 [id number] EEG No. ID: [id number] Age: 38 y.o.        Sex: female Referring Physician: Paris LoreSULLIVAN, C Report Date:  04/17/2014        Interpreting Physician: Aline BrochureSTEWART,Anas Reister R  History: Martha Chen is an 38 y.o. female admitted following 2 witnessed spells of seizure-like activity, suspicious for possible functional etiology.  Indications for study:  Rule out new onset seizure disorder.  Technique: This is an 18 channel routine scalp EEG performed at the bedside with bipolar and monopolar montages arranged in accordance to the international 10/20 system of electrode placement.   Description: This EEG recording was performed during wakefulness. Predominant background activity consisted of 9 Hz symmetrical alpha rhythm which attenuated well with eye opening. Photic stimulation produced symmetrical occipital driving response. Hyperventilation was not performed. No epileptiform discharges were recorded. There was no abnormal slowing of cerebral activity.  Interpretation: This is a normal EEG recording during wakefulness. No evidence of an epileptic disorder was demonstrated.   Venetia MaxonR [place] M.D. [facility] [phone]

## 2014-04-17 NOTE — Progress Notes (Signed)
Patient admitted after midnight. Chart reviewed. Patient examined. EEG normal. MRI c/w postictal changes. Still groggy. Monitor for 24 hours, then d/c 11/19 if stable.  Crista Curborinna Mandy Peeks, MD [facility] [phone]

## 2014-04-17 NOTE — Progress Notes (Signed)
Subjective: No further seizures.   Exam: Filed Vitals:   04/17/14 0800  BP:   Pulse:   Temp: 98.8 F (37.1 C)  Resp:    Gen: In bed, NAD MS: oriented to person, place, hospital but does not know which hospital. Awake, alert, interactive and appropriate VW:UJWJXCN:PERRL, EOMI, face symmetric Motor: 5/5 throughout Sensory:intact to LT  Impression: 38 yo F with new onset seizures, though there are some aspects of the history which are unusual, the lactic acidosis and leukocytosis would argue for true seizure.   Recommendations: 1)MRI brain 2) EEG  Ritta SlotMcNeill Barbaraann Avans, MD [facility] [phone]  If 7pm- 7am, please page neurology on call as listed in AMION.

## 2014-04-17 NOTE — Progress Notes (Signed)
UR completed.  Taneesha Edgin, RN BSN MHA CCM Trauma/Neuro ICU Case Manager [phone]  

## 2014-04-17 NOTE — H&P (Signed)
[facility] History and Physical  Martha Chen ZOX:[id number] DOB: 01/04/1976 DOA: 04/16/2014  Referring physician: ED physician PCP: No PCP Per Patient  Specialists:   Chief Complaint: seizure  HPI: Martha SchleinCrystal Chen is a 38 y.o. female without significant past medical history except for smoking, who presents with seizure.  Patient reports that she had headache earlier today and took a percocet. Shortly after, while she was talking to her daughter, she had seizures which appears to be tonic-clonic movement of bilateral UE. Lasted for less than 2 minutes. She did not have vision change, hearing loss, weakness, numbness, tingling sensations in her extremities. She is not diabetic, and denies taking hypoglycemic medications. On EMS arrival was back to baseline, normal blood glucose. On arrival to ED had another episode of seizure with shaking of bilateral upper and lower extremities.  Lasted around 1-2 minutes. ED physician notes patient was non-verbal. Given ativan after and then haldol for agitation. Patient reports being under increased stress due to her boyfriend's hospitalization recently. Patient has chronic cough because of smoking which has not changed recently. She had episode of chest pain earlier, but now denies any chest pain or shortness of breath. Per her daughter, patient has mild nausea and vomited several times with very small amount of vomitus, no blood in the vomitus in the past two days. She denies diarrhea and abdominal pain. Patient does not have fever or chills. No hematuria, hematochezia, leg edema, rashes.   She was found to have WBC 20.5, lactic acid 15.47, CO2 of 7. UDS negative, alcohol negative. CT head is negative for acute abnormalities. Patient is admitted to inpatient for further evaluation and treatment. Neurology was consulted.  Review of Systems: As presented in the history of presenting illness, rest negative.  Where does patient live?  At home Can patient  participate in ADLs? Yes  Allergy: No Known Allergies  History reviewed. No pertinent past medical history.  Past Surgical History  Procedure Laterality Date  . Cholecystectomy    . Tubal ligation      Social History:  reports that she has been smoking.  She does not have any smokeless tobacco history on file. She reports that she does not drink alcohol or use illicit drugs.  Family History:  Family History  Problem Relation Age of Onset  . Liver disease Mother   . Emphysema Father      Prior to Admission medications   Medication Sig Start Date End Date Taking? Authorizing Provider  HYDROcodone-acetaminophen (NORCO/VICODIN) 5-325 MG per tablet Take 1 tablet by mouth every 6 (six) hours as needed. 12/23/13   Roxy Horsemanobert Browning, PA-C  ibuprofen (ADVIL,MOTRIN) 800 MG tablet Take 1 tablet (800 mg total) by mouth 3 (three) times daily. 12/23/13   Roxy Horsemanobert Browning, PA-C    Physical Exam: Filed Vitals:   04/16/14 2145 04/16/14 2215 04/16/14 2230 04/16/14 2315  BP: 114/69 116/63 112/73 115/63  Pulse: 87 84 81 79  Resp: 28 28 27 24   SpO2: 99% 100% 100% 100%   General: Lethargic but easily aroused. HEENT:       Eyes: PERRL, EOMI, no scleral icterus       ENT: No discharge from the ears and nose, no pharynx injection, no tonsillar enlargement.        Neck: No JVD, no bruit, no mass felt. Cardiac: S1/S2, RRR, No murmurs, No gallops or rubs Pulm: Good air movement bilaterally. Clear to auscultation bilaterally. No rales, wheezing, rhonchi or rubs. Abd: Soft, nondistended, nontender, no rebound pain,  no organomegaly, BS present Ext: No edema bilaterally. 2+DP/PT pulse bilaterally Musculoskeletal: No joint deformities, erythema, or stiffness, ROM full Skin: No rashes.  Neuro: postictal status, very drowsy, and oriented X3, cranial nerves II-XII grossly intact, muscle strength 5/5 in all extremeties, sensation to light touch intact. Brachial reflex 2+ bilaterally. Knee reflex 1+ bilaterally.  Negative Babinski's sign. Psych: Patient is not psychotic, no suicidal or hemocidal ideation.  Labs on Admission:  Basic Metabolic Panel:  Recent Labs Lab 04/16/14 2058  NA 139  K 3.3*  CL 101  CO2 7*  GLUCOSE 136*  BUN 5*  CREATININE 0.86  CALCIUM 9.3   Liver Function Tests:  Recent Labs Lab 04/16/14 2058  AST 17  ALT 16  ALKPHOS 73  BILITOT 0.4  PROT 8.4*  ALBUMIN 4.2   No results for input(s): LIPASE, AMYLASE in the last 168 hours. No results for input(s): AMMONIA in the last 168 hours. CBC:  Recent Labs Lab 04/16/14 2058  WBC 20.5*  NEUTROABS 13.3*  HGB 15.5*  HCT 44.9  MCV 92.0  PLT 236   Cardiac Enzymes: No results for input(s): CKTOTAL, CKMB, CKMBINDEX, TROPONINI in the last 168 hours.  BNP (last 3 results) No results for input(s): PROBNP in the last 8760 hours. CBG:  Recent Labs Lab 04/16/14 2046  GLUCAP 140*    Radiological Exams on Admission: Ct Head Wo Contrast  04/16/2014   CLINICAL DATA:  Seizure-like activity. No history of seizures. Fall.  EXAM: CT HEAD WITHOUT CONTRAST  CT CERVICAL SPINE WITHOUT CONTRAST  TECHNIQUE: Multidetector CT imaging of the head and cervical spine was performed following the standard protocol without intravenous contrast. Multiplanar CT image reconstructions of the cervical spine were also generated.  COMPARISON:  CT head 10/12/2009. Cervical spine radiographs 06/11/2009.  FINDINGS: CT HEAD FINDINGS  Ventricles and sulci appear symmetrical. No mass effect or midline shift. No abnormal extra-axial fluid collections. Gray-white matter junctions are distinct. Basal cisterns are not effaced. No evidence of acute intracranial hemorrhage. No depressed skull fractures. Mucosal thickening in the left maxillary antrum. Mastoid air cells are not opacified.  CT CERVICAL SPINE FINDINGS  Normal alignment of the cervical spine and facet joints. No vertebral compression deformities. Intervertebral disc space heights are preserved.  Minimal degenerative endplate hypertrophic changes. Mild degenerative changes at C1 to. No prevertebral soft tissue swelling. C1-2 articulation appears intact. No focal bone lesion or bone destruction. Bone cortex and trabecular architecture appear intact. Soft tissues are unremarkable. Azygos lobe demonstrated in the right lung apex.  IMPRESSION: No acute intracranial abnormalities.  Normal alignment of the cervical spine. No displaced fractures identified.   Electronically Signed   By: Burman Nieves M.D.   On: 04/16/2014 21:37   Ct Cervical Spine Wo Contrast  04/16/2014   CLINICAL DATA:  Seizure-like activity. No history of seizures. Fall.  EXAM: CT HEAD WITHOUT CONTRAST  CT CERVICAL SPINE WITHOUT CONTRAST  TECHNIQUE: Multidetector CT imaging of the head and cervical spine was performed following the standard protocol without intravenous contrast. Multiplanar CT image reconstructions of the cervical spine were also generated.  COMPARISON:  CT head 10/12/2009. Cervical spine radiographs 06/11/2009.  FINDINGS: CT HEAD FINDINGS  Ventricles and sulci appear symmetrical. No mass effect or midline shift. No abnormal extra-axial fluid collections. Gray-white matter junctions are distinct. Basal cisterns are not effaced. No evidence of acute intracranial hemorrhage. No depressed skull fractures. Mucosal thickening in the left maxillary antrum. Mastoid air cells are not opacified.  CT CERVICAL SPINE FINDINGS  Normal alignment of the cervical spine and facet joints. No vertebral compression deformities. Intervertebral disc space heights are preserved. Minimal degenerative endplate hypertrophic changes. Mild degenerative changes at C1 to. No prevertebral soft tissue swelling. C1-2 articulation appears intact. No focal bone lesion or bone destruction. Bone cortex and trabecular architecture appear intact. Soft tissues are unremarkable. Azygos lobe demonstrated in the right lung apex.  IMPRESSION: No acute intracranial  abnormalities.  Normal alignment of the cervical spine. No displaced fractures identified.   Electronically Signed   By: Burman NievesWilliam  Stevens M.D.   On: 04/16/2014 21:37   Dg Chest Portable 1 View  04/16/2014   CLINICAL DATA:  Altered mental status. Patient lost consciousness today and fell to the ground. Smoker.  EXAM: PORTABLE CHEST - 1 VIEW  COMPARISON:  12/23/2013  FINDINGS: Shallow inspiration. The heart size and mediastinal contours are within normal limits. Both lungs are clear. The visualized skeletal structures are unremarkable.  IMPRESSION: No active disease.   Electronically Signed   By: Burman NievesWilliam  Stevens M.D.   On: 04/16/2014 22:13    EKG: Independently reviewed.   Assessment/Plan Principal Problem:   Seizure Active Problems:   Smoking   Nausea and vomiting   Metabolic acidosis   Leukocytosis   Hypokalemia   Seizure: etiology is not clear. Neurology was consulted. Dr. Elspeth ChoPeter Sumner evaluated patient, and recommended to hold off on AED pending further workup. - will admit to SDU - seizure precaution - follow up dr. Minus BreedingSumner's recs - EEG - MRI brain   - ativan prn for subsequent seizure  Metabolic acidosis: It is likely due to elevated lactic acid 15.47. His nausea and vomiting may have also contributed. - IVF: NS 125 cc/h - repeat lactic acid  Nausea and vomiting: Unclear etiology. Patient does not have diarrhea or abdominal pain. Patient was taken ibuprofen, which may have contributed. - check lipase - d/c ibuprofen - protonix  - reference nausea  Leukocytosis: WBC 20.5 on admission. It is most likely due to seizure. Urinalysis negative. Patient has a mild cough which is most likely due to smoking. Her lung auscultations clear bilaterally, CXR negative for acute abnormalities. - will get blood culture x 2  Hypokalemia: Potassium 3.3 -Repleted  DVT ppx: SQ Heparin    Code Status: Full code Family Communication: Yes, patient's two daughters  at bed side Disposition  Plan: Admit to inpatient   Date of Service 04/17/2014    Lorretta HarpIU, Jarris Kortz [facility] Pager [phone]  If 7PM-7AM, please contact night-coverage [email or web address] Password [email or web address] 04/17/2014, 1:22 AM

## 2014-04-17 NOTE — ED Notes (Signed)
Pt daughter Dorathy DaftKayla [phone]

## 2014-04-17 NOTE — Plan of Care (Signed)
Problem: Consults Goal: Neurology consult Outcome: Progressing Goal: Skin Care Protocol Initiated - if Braden Score 18 or less If consults are not indicated, leave blank or document N/A Outcome: Progressing  Problem: Phase I Progression Outcomes Goal: Seizure activity controlled Outcome: Progressing Goal: IV access obtained Outcome: Progressing Goal: Maintaining airway and VS stable Outcome: Progressing

## 2014-04-17 NOTE — Plan of Care (Signed)
Problem: Phase I Progression Outcomes Goal: Seizure activity controlled Outcome: Completed/Met Date Met:  04/17/14 Goal: IV access obtained Outcome: Completed/Met Date Met:  04/17/14 Goal: Maintaining airway and VS stable Outcome: Completed/Met Date Met:  04/17/14 Goal: Pain controlled with appropriate interventions Outcome: Completed/Met Date Met:  04/17/14 Goal: OOB as tolerated unless otherwise ordered Outcome: Progressing Goal: Initial discharge plan identified Outcome: Completed/Met Date Met:  04/17/14 Goal: Voiding-avoid urinary catheter unless indicated Outcome: Completed/Met Date Met:  04/17/14 Goal: Hemodynamically stable Outcome: Completed/Met Date Met:  04/17/14

## 2014-04-17 NOTE — Progress Notes (Signed)
EEG completed, results pending. 

## 2014-04-18 DIAGNOSIS — E872 Acidosis: Secondary | ICD-10-CM

## 2014-04-18 DIAGNOSIS — E876 Hypokalemia: Secondary | ICD-10-CM

## 2014-04-18 DIAGNOSIS — D72829 Elevated white blood cell count, unspecified: Secondary | ICD-10-CM

## 2014-04-18 MED ORDER — LEVETIRACETAM 500 MG PO TABS: 500.0000 mg | ORAL_TABLET | Freq: Two times a day (BID) | ORAL | Status: DC

## 2014-04-18 NOTE — Progress Notes (Signed)
Subjective: No further seizures.   Exam: Filed Vitals:   04/18/14 1138  BP: 121/82  Pulse:   Temp: 97.6 F (36.4 C)  Resp: 32   Gen: In bed, NAD MS: oriented to person, place, time ZO:XWRUECN:PERRL, EOMI, face symmetric Motor: 5/5 throughout Sensory:intact to LT  Impression: 38 yo F with new onset seizures. I discussed recurrance rates and benefitis vs risks of starting AED now vs waiting. Given multiple seizures, I feel that starting would be reasonable and she wishes to pursue therapy.   With return to baseline, I feel infarct is unlikely, adn with no infectious signs, I think abcess would be very unlikley. I feel therefore that MRI findings are mcuh more consistent with pos-ictal phenomenon. She knowns to return to the ED with any fevers, headaches, etc.   Recommendations: 1) keppra 500mg  BID 2) Patient is unable to drive, operate heavy machinery, perform activities at heights or participate in water activities until release by outpatient physician. This was discussed with the patient who expressed understanding.    Ritta SlotMcNeill Aquan Kope, MD [facility] [phone]  If 7pm- 7am, please page neurology on call as listed in AMION.

## 2014-04-18 NOTE — Plan of Care (Signed)
Problem: Discharge Progression Outcomes Goal: Barriers To Progression Addressed/Resolved Outcome: Completed/Met Date Met:  04/18/14 Goal: Discharge plan in place and appropriate Outcome: Completed/Met Date Met:  04/18/14 Goal: Pain controlled with appropriate interventions Outcome: Completed/Met Date Met:  04/18/14 Goal: Vital signs stable Outcome: Completed/Met Date Met:  04/18/14 Goal: Hemodynamically stable Outcome: Completed/Met Date Met:  52/08/02 Goal: Complications resolved/controlled Outcome: Completed/Met Date Met:  04/18/14 Goal: Tolerating diet Outcome: Completed/Met Date Met:  04/18/14 Goal: Activity appropriate for discharge plan Outcome: Completed/Met Date Met:  04/18/14 Goal: School Care Plan in place Outcome: Not Applicable Date Met:  23/36/12

## 2014-04-18 NOTE — Plan of Care (Signed)
Problem: Phase I Progression Outcomes Goal: OOB as tolerated unless otherwise ordered Outcome: Completed/Met Date Met:  04/18/14  Problem: Phase II Progression Outcomes Goal: Seizure activity resolved/baseline Outcome: Completed/Met Date Met:  04/18/14 Goal: Pain controlled Outcome: Completed/Met Date Met:  04/18/14 Goal: Progress activity as tolerated unless otherwise ordered Outcome: Completed/Met Date Met:  04/18/14 Goal: Discharge plan established Outcome: Completed/Met Date Met:  04/18/14 Goal: Tolerating diet when awake Outcome: Completed/Met Date Met:  04/18/14 Goal: IV converted to [facility] or NSL Outcome: Progressing

## 2014-04-18 NOTE — Discharge Summary (Addendum)
Physician Discharge Summary  Tekesha Almgren ZOX:[id number] DOB: Nov 27, 1975 DOA: 04/16/2014  PCP: No PCP Per Patient  Admit date: 04/16/2014 Discharge date: 04/18/2014  Time spent: greater than 30 minutes  Discharge Diagnoses:  Principal Problem:   Seizure Active Problems:   Smoking   Nausea and vomiting   Metabolic acidosis   Leukocytosis   Hypokalemia   Discharge Condition: stable  Filed Weights   04/17/14 0510 04/17/14 1319  Weight: 81.7 kg (180 lb 1.9 oz) 81.6 kg (179 lb 14.3 oz)    History of present illness:  38 y.o. female without significant past medical history except for smoking, who presents with seizure.  Patient reports that she had headache earlier today and took a percocet. Shortly after, while she was talking to her daughter, she had seizures which appears to be tonic-clonic movement of bilateral UE. Lasted for less than 2 minutes. She did not have vision change, hearing loss, weakness, numbness, tingling sensations in her extremities. She is not diabetic, and denies taking hypoglycemic medications. On EMS arrival was back to baseline, normal blood glucose. On arrival to ED had another episode of seizure with shaking of bilateral upper and lower extremities. Lasted around 1-2 minutes. ED physician notes patient was non-verbal. Given ativan after and then haldol for agitation. Patient reports being under increased stress due to her boyfriend's hospitalization recently. Patient has chronic cough because of smoking which has not changed recently. She had episode of chest pain earlier, but now denies any chest pain or shortness of breath. Per her daughter, patient has mild nausea and vomited several times with very small amount of vomitus, no blood in the vomitus in the past two days. She denies diarrhea and abdominal pain. Patient does not have fever or chills. No hematuria, hematochezia, leg edema, rashes.   She was found to have WBC 20.5, lactic acid 15.47, CO2 of 7. UDS  negative, alcohol negative. CT head is negative for acute abnormalities. Patient is admitted to inpatient for further evaluation and treatment. Neurology was consulted.  Hospital Course:  Admitted to stepdown unit. Started on keppra. No further seizures. Fluid rescusitated and metabolic acidosis resolved. MRI consistent with postictal changes.  By discharge, mental status back to baseline, stable labs and vital signs, and nonfocal exam.  Will need f/u with neurology as outpatient  Procedures:  none  Consultations:  neurology  Discharge Exam: Filed Vitals:   04/18/14 1138  BP: 121/82  Pulse:   Temp: 97.6 F (36.4 C)  Resp: 32    General: a and o Cardiovascular: RRR Respiratory: CTA Neuro: nonfocal  Discharge Instructions    Diet general    Complete by:  As directed      Driving Restrictions    Complete by:  As directed   No driving for 6 months          Current Discharge Medication List    START taking these medications   Details  levETIRAcetam (KEPPRA) 500 MG tablet Take 1 tablet (500 mg total) by mouth 2 (two) times daily. Qty: 60 tablet, Refills: 1       No Known Allergies Follow-up Information    Follow up with [facility].   Why:  for seizures   Contact information:   [address] [place] [address] [phone]       The results of significant diagnostics from this hospitalization (including imaging, microbiology, ancillary and laboratory) are listed below for reference.    Significant Diagnostic Studies: Ct Head  Wo Contrast  04/16/2014   CLINICAL DATA:  Seizure-like activity. No history of seizures. Fall.  EXAM: CT HEAD WITHOUT CONTRAST  CT CERVICAL SPINE WITHOUT CONTRAST  TECHNIQUE: Multidetector CT imaging of the head and cervical spine was performed following the standard protocol without intravenous contrast. Multiplanar CT image reconstructions of the cervical spine were also generated.   COMPARISON:  CT head 10/12/2009. Cervical spine radiographs 06/11/2009.  FINDINGS: CT HEAD FINDINGS  Ventricles and sulci appear symmetrical. No mass effect or midline shift. No abnormal extra-axial fluid collections. Gray-white matter junctions are distinct. Basal cisterns are not effaced. No evidence of acute intracranial hemorrhage. No depressed skull fractures. Mucosal thickening in the left maxillary antrum. Mastoid air cells are not opacified.  CT CERVICAL SPINE FINDINGS  Normal alignment of the cervical spine and facet joints. No vertebral compression deformities. Intervertebral disc space heights are preserved. Minimal degenerative endplate hypertrophic changes. Mild degenerative changes at C1 to. No prevertebral soft tissue swelling. C1-2 articulation appears intact. No focal bone lesion or bone destruction. Bone cortex and trabecular architecture appear intact. Soft tissues are unremarkable. Azygos lobe demonstrated in the right lung apex.  IMPRESSION: No acute intracranial abnormalities.  Normal alignment of the cervical spine. No displaced fractures identified.   Electronically Signed   By: Burman NievesWilliam  Stevens M.D.   On: 04/16/2014 21:37   Ct Cervical Spine Wo Contrast  04/16/2014   CLINICAL DATA:  Seizure-like activity. No history of seizures. Fall.  EXAM: CT HEAD WITHOUT CONTRAST  CT CERVICAL SPINE WITHOUT CONTRAST  TECHNIQUE: Multidetector CT imaging of the head and cervical spine was performed following the standard protocol without intravenous contrast. Multiplanar CT image reconstructions of the cervical spine were also generated.  COMPARISON:  CT head 10/12/2009. Cervical spine radiographs 06/11/2009.  FINDINGS: CT HEAD FINDINGS  Ventricles and sulci appear symmetrical. No mass effect or midline shift. No abnormal extra-axial fluid collections. Gray-white matter junctions are distinct. Basal cisterns are not effaced. No evidence of acute intracranial hemorrhage. No depressed skull fractures.  Mucosal thickening in the left maxillary antrum. Mastoid air cells are not opacified.  CT CERVICAL SPINE FINDINGS  Normal alignment of the cervical spine and facet joints. No vertebral compression deformities. Intervertebral disc space heights are preserved. Minimal degenerative endplate hypertrophic changes. Mild degenerative changes at C1 to. No prevertebral soft tissue swelling. C1-2 articulation appears intact. No focal bone lesion or bone destruction. Bone cortex and trabecular architecture appear intact. Soft tissues are unremarkable. Azygos lobe demonstrated in the right lung apex.  IMPRESSION: No acute intracranial abnormalities.  Normal alignment of the cervical spine. No displaced fractures identified.   Electronically Signed   By: Burman NievesWilliam  Stevens M.D.   On: 04/16/2014 21:37   Mr Brain Wo Contrast  04/17/2014   CLINICAL DATA:  Headache. Seizure like activity today, without history of prior seizure. Elevated white count. Abnormal lactic acid level. Initial encounter.  EXAM: MRI HEAD WITHOUT CONTRAST  TECHNIQUE: Multiplanar, multiecho pulse sequences of the brain and surrounding structures were obtained without intravenous contrast.  COMPARISON:  CT head earlier today.  FINDINGS: There is a small focus of restricted diffusion, RIGHT parietal cortex, of a nonspecific nature. This could represent a small acute infarct, small abscess, or postictal phenomenon. The lesion is difficult to identify on T2 and FLAIR imaging. No other similar lesions.  No hemorrhage, mass lesion, hydrocephalus, or extra-axial fluid. Normal cerebral volume. No significant white matter disease. Flow voids are maintained throughout the carotid, basilar, and vertebral arteries. There are no  areas of chronic hemorrhage. Pituitary, pineal, and cerebellar tonsils unremarkable. No upper cervical lesions.  Negative orbits. Chronic LEFT maxillary sinus disease. Some fluid layering in the LEFT division sphenoid. BILATERAL cervical  adenopathy, incompletely evaluated with a LEFT level 2 node measuring up to 10 mm short axis.  IMPRESSION: Small focus of restricted diffusion RIGHT parietal cortex could represent an acute infarct, abscess, or postictal phenomenon. No other similar lesions are seen.  Chronic and acute sinusitis.  Nonspecific BILATERAL cervical adenopathy, probably greater on the LEFT.   Electronically Signed   By: Davonna BellingJohn  Curnes M.D.   On: 04/17/2014 12:34   Dg Chest Portable 1 View  04/16/2014   CLINICAL DATA:  Altered mental status. Patient lost consciousness today and fell to the ground. Smoker.  EXAM: PORTABLE CHEST - 1 VIEW  COMPARISON:  12/23/2013  FINDINGS: Shallow inspiration. The heart size and mediastinal contours are within normal limits. Both lungs are clear. The visualized skeletal structures are unremarkable.  IMPRESSION: No active disease.   Electronically Signed   By: Burman NievesWilliam  Stevens M.D.   On: 04/16/2014 22:13   EEG normal  Microbiology: Recent Results (from the past 240 hour(s))  Culture, blood (routine x 2)     Status: None (Preliminary result)   Collection Time: 04/17/14  1:30 AM  Result Value Ref Range Status   Specimen Description BLOOD LEFT ARM  Final   Special Requests BOTTLES DRAWN AEROBIC AND ANAEROBIC 10 ML  Final   Culture  Setup Time   Final    04/17/2014 09:23 Performed at [facility]    Culture   Final           BLOOD CULTURE RECEIVED NO GROWTH TO DATE CULTURE WILL BE HELD FOR 5 DAYS BEFORE ISSUING A FINAL NEGATIVE REPORT Performed at [facility]    Report Status PENDING  Incomplete  Culture, blood (routine x 2)     Status: None (Preliminary result)   Collection Time: 04/17/14  1:45 AM  Result Value Ref Range Status   Specimen Description BLOOD RIGHT HAND  Final   Special Requests BOTTLES DRAWN AEROBIC ONLY 3 ML  Final   Culture  Setup Time   Final    04/17/2014 09:23 Performed at [facility]    Culture   Final           BLOOD CULTURE  RECEIVED NO GROWTH TO DATE CULTURE WILL BE HELD FOR 5 DAYS BEFORE ISSUING A FINAL NEGATIVE REPORT Performed at [facility]    Report Status PENDING  Incomplete  MRSA PCR Screening     Status: None   Collection Time: 04/17/14  5:06 AM  Result Value Ref Range Status   MRSA by PCR NEGATIVE NEGATIVE Final    Comment:        The GeneXpert MRSA Assay (FDA approved for NASAL specimens only), is one component of a comprehensive MRSA colonization surveillance program. It is not intended to diagnose MRSA infection nor to guide or monitor treatment for MRSA infections.      Labs: Basic Metabolic Panel:  Recent Labs Lab 04/16/14 2058 04/17/14 0150  NA 139 141  K 3.3* 3.4*  CL 101 109  CO2 7* 17*  GLUCOSE 136* 110*  BUN 5* 5*  CREATININE 0.86 0.84  CALCIUM 9.3 8.8   Liver Function Tests:  Recent Labs Lab 04/16/14 2058 04/17/14 0150  AST 17 14  ALT 16 13  ALKPHOS 73 61  BILITOT 0.4 0.4  PROT 8.4*  7.1  ALBUMIN 4.2 3.8    Recent Labs Lab 04/17/14 0150  LIPASE 26   No results for input(s): AMMONIA in the last 168 hours. CBC:  Recent Labs Lab 04/16/14 2058 04/17/14 0150  WBC 20.5* 19.9*  NEUTROABS 13.3*  --   HGB 15.5* 13.6  HCT 44.9 38.4  MCV 92.0 89.3  PLT 236 182   Cardiac Enzymes: No results for input(s): CKTOTAL, CKMB, CKMBINDEX, TROPONINI in the last 168 hours. BNP: BNP (last 3 results) No results for input(s): PROBNP in the last 8760 hours. CBG:  Recent Labs Lab 04/16/14 2046  GLUCAP 140*       Signed:  Talli Kimmer L  [facility] 04/18/2014, 1:38 PM

## 2014-04-18 NOTE — Progress Notes (Signed)
Marliyah Gawron to be D/C'd Home per MD order.  Discussed with the patient and all questions fully answered.    Medication List    TAKE these medications        levETIRAcetam 500 MG tablet  Commonly known as:  KEPPRA  Take 1 tablet (500 mg total) by mouth 2 (two) times daily.        VVS, Skin clean, dry and intact without evidence of skin break down, no evidence of skin tears noted. IV catheter discontinued intact. Site without signs and symptoms of complications. Dressing and pressure applied.  An After Visit Summary was printed and given to the patient.  D/c education completed with patient/family including follow up instructions, medication list, d/c activities limitations if indicated, with other d/c instructions as indicated by MD - patient able to verbalize understanding, all questions fully answered.   Patient instructed to return to ED, call 911, or call MD for any changes in condition.   Patient escorted via WC, and D/C home via private auto.  Osvaldo HumanSchiller, Lyon Dumont [facility] 04/18/2014 2:41 PM

## 2014-04-23 LAB — CULTURE, BLOOD (ROUTINE X 2)
CULTURE: NO GROWTH
Culture: NO GROWTH

## 2014-04-29 ENCOUNTER — Encounter: Payer: Self-pay | Admitting: Neurology

## 2014-04-29 ENCOUNTER — Ambulatory Visit (INDEPENDENT_AMBULATORY_CARE_PROVIDER_SITE_OTHER): Payer: Medicaid Other | Admitting: Neurology

## 2014-04-29 VITALS — BP 122/86 | HR 77 | Ht 64.0 in | Wt 189.0 lb

## 2014-04-29 DIAGNOSIS — I6789 Other cerebrovascular disease: Secondary | ICD-10-CM

## 2014-04-29 DIAGNOSIS — R519 Headache, unspecified: Secondary | ICD-10-CM

## 2014-04-29 DIAGNOSIS — G44221 Chronic tension-type headache, intractable: Secondary | ICD-10-CM

## 2014-04-29 DIAGNOSIS — R51 Headache: Secondary | ICD-10-CM

## 2014-04-29 DIAGNOSIS — R569 Unspecified convulsions: Secondary | ICD-10-CM

## 2014-04-29 HISTORY — DX: Headache, unspecified: R51.9

## 2014-04-29 NOTE — Patient Instructions (Signed)

## 2014-04-29 NOTE — Progress Notes (Signed)
Reason for visit: Seizure  Martha Chen is a 38 y.o. female  History of present illness:  [name] is a 38 year old right-handed white female with a history of a seizure event that occurred on 04/16/2014. The patient has never had seizures before. She does not give any history of any significant head trauma previously. The patient had not slept well for about 4 days prior to the onset of the seizure, and she was not eating well. The patient had a negative urine drug screen in the hospital. She did bite her tongue, and lost control the bladder with the seizure. Apparently, the patient had a two-minute seizure that was generalized, and she may have had another seizure in the hospital. At that time, Keppra was added. The patient has no recollection of the seizure itself, she had no warning of the seizure. The patient indicates that over the last month she has had daily headaches. The patient denies any focal numbness or weakness of the face, arms, or legs. She denies any visual complaints, balance problems, or trouble controlling the bowels or the bladder. MRI of the brain was done showing a punctate area of what appeared to be an acute ischemic event involving the right parietal area. The patient comes to this office for an evaluation. She indicates that the Keppra is causing some drowsiness.   Past Medical History  Diagnosis Date  . Seizures   . Headache 04/29/2014    Past Surgical History  Procedure Laterality Date  . Cholecystectomy    . Tubal ligation      Family History  Problem Relation Age of Onset  . Liver disease Mother   . Sarcoidosis Mother   . Emphysema Father   . Seizures Neg Hx     Social history:  reports that she has been smoking Cigarettes.  She has been smoking about 1.00 pack per day. She has never used smokeless tobacco. She reports that she does not drink alcohol or use illicit drugs.  Medications:  Current Outpatient Prescriptions on File Prior to Visit    Medication Sig Dispense Refill  . levETIRAcetam (KEPPRA) 500 MG tablet Take 1 tablet (500 mg total) by mouth 2 (two) times daily. 60 tablet 1   No current facility-administered medications on file prior to visit.     No Known Allergies  ROS:  Out of a complete 14 system review of symptoms, the patient complains only of the following symptoms, and all other reviewed systems are negative.  Fevers, chills Eye pain Feeling hot, cold Headache, dizziness Depression, anxiety  Blood pressure 122/86, pulse 77, height 5\' 4"  (1.626 m), weight 189 lb (85.73 kg).  Physical Exam  General: The patient is alert and cooperative at the time of the examination. The patient is minimally obese.  Eyes: Pupils are equal, round, and reactive to light. Discs are flat bilaterally.  Neck: The neck is supple, no carotid bruits are noted.  Respiratory: The respiratory examination is clear.  Cardiovascular: The cardiovascular examination reveals a regular rate and rhythm, no obvious murmurs or rubs are noted.  Skin: Extremities are without significant edema.  Neurologic Exam  Mental status: The patient is alert and oriented x 3 at the time of the examination. The patient has apparent normal recent and remote memory, with an apparently normal attention span and concentration ability.  Cranial nerves: Facial symmetry is present. There is good sensation of the face to pinprick and soft touch bilaterally. The strength of the facial muscles and the  muscles to head turning and shoulder shrug are normal bilaterally. Speech is well enunciated, no aphasia or dysarthria is noted. Extraocular movements are full. Visual fields are full. The tongue is midline, and the patient has symmetric elevation of the soft palate. No obvious hearing deficits are noted.  Motor: The motor testing reveals 5 over 5 strength of all 4 extremities. Good symmetric motor tone is noted throughout.  Sensory: Sensory testing is intact to  pinprick, soft touch, vibration sensation, and position sense on all 4 extremities. No evidence of extinction is noted.  Coordination: Cerebellar testing reveals good finger-nose-finger and heel-to-shin bilaterally.  Gait and station: Gait is normal. Tandem gait is normal. Romberg is negative. No drift is seen.  Reflexes: Deep tendon reflexes are symmetric and normal bilaterally. Toes are downgoing bilaterally.     MRI brain 04/16/14:  IMPRESSION: Small focus of restricted diffusion RIGHT parietal cortex could represent an acute infarct, abscess, or postictal phenomenon. No other similar lesions are seen.  Chronic and acute sinusitis.  Nonspecific BILATERAL cervical adenopathy, probably greater on the LEFT.   EEG report 04/17/14:  Interpretation: This is a normal EEG recording during wakefulness. No evidence of an epileptic disorder was demonstrated.   Assessment/Plan:  1. Seizures, new onset  2. Abnormal MRI brain  The patient may have a small punctate stroke on the right parietal area of the brain, I am not clear that this was the etiology of the seizure event. The patient will go on low-dose aspirin, and she will be set up for a carotid Doppler study and a 2-D echocardiogram. The patient will continue the Keppra, she is not to operate a motor vehicle for 6 months following the last seizure. She will follow-up through this office in 4 months.  Marlan Palau. Keith Willis MD 04/29/2014 7:06 PM  [facility] [address] [place], [place] [id number]  Phone [phone] Fax [phone]

## 2014-05-07 ENCOUNTER — Telehealth: Payer: Self-pay | Admitting: Neurology

## 2014-05-07 ENCOUNTER — Ambulatory Visit (HOSPITAL_COMMUNITY): Payer: Medicaid Other | Attending: Neurology

## 2014-05-07 DIAGNOSIS — R569 Unspecified convulsions: Secondary | ICD-10-CM

## 2014-05-07 DIAGNOSIS — I6789 Other cerebrovascular disease: Secondary | ICD-10-CM

## 2014-05-07 DIAGNOSIS — I679 Cerebrovascular disease, unspecified: Secondary | ICD-10-CM | POA: Insufficient documentation

## 2014-05-07 DIAGNOSIS — G44221 Chronic tension-type headache, intractable: Secondary | ICD-10-CM | POA: Diagnosis not present

## 2014-05-07 NOTE — Telephone Encounter (Signed)
  I called patient. The 2-D echocardiogram results were relatively unremarkable. The carotid Doppler is pending.  2-D echocardiogram 05/07/14:  Study Conclusions  - Left ventricle: The cavity size was mildly dilated. Systolic function was normal. The estimated ejection fraction was in the range of 60% to 65%. Wall motion was normal; there were no regional wall motion abnormalities. The pulmonary vein flow pattern was normal. Left ventricular diastolic function parameters were normal. - Aortic valve: Transvalvular velocity was within the normal range. There was no stenosis. There was moderate regurgitation. - Aortic root: The aortic root was normal in size. - Mitral valve: Structurally normal valve. There was trivial regurgitation. Valve area by pressure half-time: 1.22 cm^2. - Right ventricle: Systolic function was normal. - Right atrium: The atrium was normal in size. - Pulmonic valve: There was no regurgitation. - Pulmonary arteries: Systolic pressure was within the normal range. - Inferior vena cava: The vessel was normal in size. - Pericardium, extracardiac: There was no pericardial effusion.

## 2014-05-07 NOTE — Progress Notes (Signed)
2D Echo completed. 05/07/2014 

## 2014-05-16 ENCOUNTER — Ambulatory Visit (INDEPENDENT_AMBULATORY_CARE_PROVIDER_SITE_OTHER): Payer: Medicaid Other

## 2014-05-16 DIAGNOSIS — R569 Unspecified convulsions: Secondary | ICD-10-CM

## 2014-05-16 DIAGNOSIS — G44221 Chronic tension-type headache, intractable: Secondary | ICD-10-CM

## 2014-05-16 DIAGNOSIS — I6789 Other cerebrovascular disease: Secondary | ICD-10-CM

## 2014-06-03 ENCOUNTER — Telehealth: Payer: Self-pay | Admitting: Neurology

## 2014-06-03 NOTE — Telephone Encounter (Signed)
I called the patient. The carotid Doppler study was unremarkable. 2-D echocardiogram was done on 05/07/2014, no source of stroke seen.   2-D echocardiogram 05/07/2014:  Study Conclusions  - Left ventricle: The cavity size was mildly dilated. Systolic function was normal. The estimated ejection fraction was in the range of 60% to 65%. Wall motion was normal; there were no regional wall motion abnormalities. The pulmonary vein flow pattern was normal. Left ventricular diastolic function parameters were normal. - Aortic valve: Transvalvular velocity was within the normal range. There was no stenosis. There was moderate regurgitation. - Aortic root: The aortic root was normal in size. - Mitral valve: Structurally normal valve. There was trivial regurgitation. Valve area by pressure half-time: 1.22 cm^2. - Right ventricle: Systolic function was normal. - Right atrium: The atrium was normal in size. - Pulmonic valve: There was no regurgitation. - Pulmonary arteries: Systolic pressure was within the normal range. - Inferior vena cava: The vessel was normal in size. - Pericardium, extracardiac: There was no pericardial effusion.

## 2014-08-27 ENCOUNTER — Ambulatory Visit: Payer: Self-pay | Admitting: Adult Health

## 2014-08-28 ENCOUNTER — Encounter: Payer: Self-pay | Admitting: Adult Health

## 2014-08-28 ENCOUNTER — Ambulatory Visit (INDEPENDENT_AMBULATORY_CARE_PROVIDER_SITE_OTHER): Payer: Medicaid Other | Admitting: Adult Health

## 2014-08-28 VITALS — BP 117/74 | HR 72 | Ht 64.0 in | Wt 181.0 lb

## 2014-08-28 DIAGNOSIS — R569 Unspecified convulsions: Secondary | ICD-10-CM | POA: Diagnosis not present

## 2014-08-28 MED ORDER — LEVETIRACETAM 750 MG PO TABS: 750.0000 mg | ORAL_TABLET | Freq: Two times a day (BID) | ORAL | Status: DC

## 2014-08-28 NOTE — Patient Instructions (Signed)
Increase Keppra to 750 mg twice a day.  If you have any seizure events let us know.

## 2014-08-28 NOTE — Progress Notes (Signed)
I have read the note, and I agree with the clinical assessment and plan.  WILLIS,CHARLES KEITH   

## 2014-08-28 NOTE — Progress Notes (Signed)
PATIENT: Martha Martha DOB: 01/06/1976  REASON FOR VISIT: follow up- seizures HISTORY FROM: patient  HISTORY OF PRESENT ILLNESS: Martha Martha is a 39 year old female with a history of seizures. She returns today for follow-up. The patient has had 1 isolated seizure on 04/16/2014. She is currently taking Keppra and tolerating it well. She denies any additional seizure events. She does state that she will have episodes of dizziness and will sometimes have numbness that occurs in the left arm. She states when the numbness occurs in the left arm it starts at the fingertips and travels up the arm and usually occurs when she is under stress. She states after several minutes and moving her arm around the numbness will descend from the top of her arm to her fingertips. She also states that sometimes if she walks out in the sunlight it'll cause her to have a weird feeling in her stomach as if she was going to black out. The patient states that these sensations were occurring prior to her seizure event in November. She denies any strokelike symptoms. She continues to take aspirin daily. Right now the patient does not have a primary care provider but is in the process of getting this establish. She returns today for an evaluation.  HISTORY 04/29/14 (WILLIS): Martha Martha is a 39 year old right-handed white female with a history of a seizure event that occurred on 04/16/2014. The patient has never had seizures before. She does not give any history of any significant head trauma previously. The patient had not slept well for about 4 days prior to the onset of the seizure, and she was not eating well. The patient had a negative urine drug screen in the hospital. She did bite her tongue, and lost control the bladder with the seizure. Apparently, the patient had a two-minute seizure that was generalized, and she may have had another seizure in the hospital. At that time, Keppra was added. The patient has no recollection of  the seizure itself, she had no warning of the seizure. The patient indicates that over the last month she has had daily headaches. The patient denies any focal numbness or weakness of the face, arms, or legs. She denies any visual complaints, balance problems, or trouble controlling the bowels or the bladder. MRI of the brain was done showing a punctate area of what appeared to be an acute ischemic event involving the right parietal area. The patient comes to this office for an evaluation. She indicates that the Keppra is causing some drowsiness.    REVIEW OF SYSTEMS: Out of a complete 14 system review of symptoms, the patient complains only of the following symptoms, and all other reviewed systems are negative.  Choking, light sensitivity, back pain, aching muscles, neck pain, neck stiffness, dizziness, headache, numbness    ALLERGIES: No Known Allergies  HOME MEDICATIONS: Outpatient Prescriptions Prior to Visit  Medication Sig Dispense Refill  . levETIRAcetam (KEPPRA) 500 MG tablet Take 1 tablet (500 mg total) by mouth 2 (two) times daily. 60 tablet 1   No facility-administered medications prior to visit.    PAST MEDICAL HISTORY: Past Medical History  Diagnosis Date  . Seizures   . Headache 04/29/2014    PAST SURGICAL HISTORY: Past Surgical History  Procedure Laterality Date  . Cholecystectomy    . Tubal ligation      FAMILY HISTORY: Family History  Problem Relation Age of Onset  . Liver disease Mother   . Sarcoidosis Mother   . Emphysema Father   .  Seizures Neg Hx     SOCIAL HISTORY: History   Social History  . Marital Status: Married    Spouse Name: N/A  . Number of Children: 2  . Years of Education: 7th grade   Occupational History  . Not on file.   Social History Main Topics  . Smoking status: Current Every Day Smoker -- 1.00 packs/day    Types: Cigarettes  . Smokeless tobacco: Never Used  . Alcohol Use: No  . Drug Use: No  . Sexual Activity: Not on  file   Other Topics Concern  . Not on file   Social History Narrative   Patient is separated    Patient is right handed.      PHYSICAL EXAM  Filed Vitals:   08/28/14 1452  BP: 117/74  Pulse: 72  Height: 5\' 4"  (1.626 m)  Weight: 181 lb (82.101 kg)   Body mass index is 31.05 kg/(m^2). Generalized: Well developed, in no acute distress   Neurological examination  Mentation: Alert oriented to time, place, history taking. Follows all commands speech and language fluent Cranial nerve II-XII: Pupils were equal round reactive to light. Extraocular movements were full, visual field were full on confrontational test. Facial sensation and strength were normal. Uvula tongue midline. Head turning and shoulder shrug  were normal and symmetric. Motor: The motor testing reveals 5 over 5 strength of all 4 extremities. Good symmetric motor tone is noted throughout.  Sensory: Sensory testing is intact to soft touch on all 4 extremities. No evidence of extinction is noted.  Coordination: Cerebellar testing reveals good finger-nose-finger and heel-to-shin bilaterally.  Gait and station: Gait is normal. Tandem gait is normal. Romberg is negative. No drift is seen.  Reflexes: Deep tendon reflexes are symmetric and normal bilaterally.   DIAGNOSTIC DATA (LABS, IMAGING, TESTING) - I reviewed patient records, labs, notes, testing and imaging myself where available.     ASSESSMENT AND PLAN 39 y.o. year old female  has a past medical history of Seizures and Headache (04/29/2014). here with:  1. Seizure  The patient reports that she's not had any seizure events since November. However she does describe some numbness that occurs in the left arm that starts at the fingertips and travels up the arm. This could be a Jacksonian type seizure. I will increase the patient's Keppra to 750 mg twice a day. She will let me know if this is beneficial. If she has any additional seizure events she will let me know.  Otherwise she will follow-up in 3-4 months or sooner if needed.   Martha PennyMegan Martha Ruppel, MSN, NP-C 08/28/2014, 3:00 PM [facility] [address], [address] [place], [place] [address] [phone]  Note: This document was prepared with digital dictation and possible smart phrase technology. Any transcriptional errors that result from this process are unintentional.

## 2015-01-07 ENCOUNTER — Encounter: Payer: Self-pay | Admitting: Adult Health

## 2015-01-07 ENCOUNTER — Ambulatory Visit (INDEPENDENT_AMBULATORY_CARE_PROVIDER_SITE_OTHER): Payer: Medicaid Other | Admitting: Adult Health

## 2015-01-07 VITALS — BP 98/63 | HR 55 | Ht 64.0 in | Wt 173.0 lb

## 2015-01-07 DIAGNOSIS — R569 Unspecified convulsions: Secondary | ICD-10-CM | POA: Insufficient documentation

## 2015-01-07 NOTE — Patient Instructions (Signed)
Continue Keppra If you have any seizure events please let us know.   

## 2015-01-07 NOTE — Progress Notes (Signed)
PATIENT: Martha Chen DOB: 08-17-1975  REASON FOR VISIT: follow up- seizures HISTORY FROM: patient  HISTORY OF PRESENT ILLNESS: Martha Chen is a 39 year old female with a history of seizures. She returns today for follow-up. The patient reports that she is not had any additional seizures. At the last visit we increased the Keppra and she has tolerated that well. She is able to complete all ADLs independently. She operates a Librarian, academic without difficulty. Denies any changes with her gait or balance. She does mention that she feels a little off balance today. She contributes this to her low blood pressure. She denies any new neurological symptoms. She returns today for an evaluation.  HISTORY 08/28/14: Martha Chen  is a 39 year old female with a history of seizures. She returns today for follow-up. The patient has had 1 isolated seizure on 04/16/2014. She is currently taking Keppra and tolerating it well. She denies any additional seizure events. She does state that she will have episodes of dizziness and will sometimes have numbness that occurs in the left arm. She states when the numbness occurs in the left arm it starts at the fingertips and travels up the arm and usually occurs when she is under stress. She states after several minutes and moving her arm around the numbness will descend from the top of her arm to her fingertips. She also states that sometimes if she walks out in the sunlight it'll cause her to have a weird feeling in her stomach as if she was going to black out. The patient states that these sensations were occurring prior to her seizure event in November. She denies any strokelike symptoms. She continues to take aspirin daily. Right now the patient does not have a primary care provider but is in the process of getting this establish. She returns today for an evaluation.  HISTORY 04/29/14 (WILLIS): Martha Chen is a 39 year old right-handed white female with a history of a seizure event  that occurred on 04/16/2014. The patient has never had seizures before. She does not give any history of any significant head trauma previously. The patient had not slept well for about 4 days prior to the onset of the seizure, and she was not eating well. The patient had a negative urine drug screen in the hospital. She did bite her tongue, and lost control the bladder with the seizure. Apparently, the patient had a two-minute seizure that was generalized, and she may have had another seizure in the hospital. At that time, Keppra was added. The patient has no recollection of the seizure itself, she had no warning of the seizure. The patient indicates that over the last month she has had daily headaches. The patient denies any focal numbness or weakness of the face, arms, or legs. She denies any visual complaints, balance problems, or trouble controlling the bowels or the bladder. MRI of the brain was done showing a punctate area of what appeared to be an acute ischemic event involving the right parietal area. The patient comes to this office for an evaluation. She indicates that the Keppra is causing some drowsiness.:   REVIEW OF SYSTEMS: Out of a complete 14 system review of symptoms, the patient complains only of the following symptoms, and all other reviewed systems are negative.  Light sensitivity, restless leg, insomnia, back pain, depression, nervous/anxious, dizziness, headache  ALLERGIES: No Known Allergies  HOME MEDICATIONS: Outpatient Prescriptions Prior to Visit  Medication Sig Dispense Refill  . levETIRAcetam (KEPPRA) 750 MG tablet Take 1  tablet (750 mg total) by mouth 2 (two) times daily. 60 tablet 3   No facility-administered medications prior to visit.    PAST MEDICAL HISTORY: Past Medical History  Diagnosis Date  . Seizures   . Headache 04/29/2014    PAST SURGICAL HISTORY: Past Surgical History  Procedure Laterality Date  . Cholecystectomy    . Tubal ligation       FAMILY HISTORY: Family History  Problem Relation Age of Onset  . Liver disease Mother   . Sarcoidosis Mother   . Emphysema Father   . Seizures Neg Hx     SOCIAL HISTORY: History   Social History  . Marital Status: Married    Spouse Name: N/A  . Number of Children: 2  . Years of Education: 7th grade   Occupational History  . Not on file.   Social History Main Topics  . Smoking status: Current Every Day Smoker -- 1.00 packs/day    Types: Cigarettes  . Smokeless tobacco: Never Used  . Alcohol Use: No  . Drug Use: No  . Sexual Activity: Not on file   Other Topics Concern  . Not on file   Social History Narrative   Patient is separated    Patient is right handed.      PHYSICAL EXAM  Filed Vitals:   01/07/15 1130 01/07/15 1135  BP: 100/56 98/63  Pulse: 54 55  Height: 5\' 4"  (1.626 m)   Weight: 173 lb (78.472 kg)    Body mass index is 29.68 kg/(m^2).  Generalized: Well developed, in no acute distress   Neurological examination  Mentation: Alert oriented to time, place, history taking. Follows all commands speech and language fluent Cranial nerve II-XII: Pupils were equal round reactive to light. Extraocular movements were full, visual field were full on confrontational test. Facial sensation and strength were normal. Uvula tongue midline. Head turning and shoulder shrug  were normal and symmetric. Motor: The motor testing reveals 5 over 5 strength of all 4 extremities. Good symmetric motor tone is noted throughout.  Sensory: Sensory testing is intact to soft touch on all 4 extremities. No evidence of extinction is noted.  Coordination: Cerebellar testing reveals good finger-nose-finger and heel-to-shin bilaterally.  Gait and station: Gait is normal. Tandem gait is normal. Romberg is negative. No drift is seen.  Reflexes: Deep tendon reflexes are symmetric and normal bilaterally.   DIAGNOSTIC DATA (LABS, IMAGING, TESTING) - I reviewed patient records, labs,  notes, testing and imaging myself where available.  Lab Results  Component Value Date   WBC 19.9* 04/17/2014   HGB 13.6 04/17/2014   HCT 38.4 04/17/2014   MCV 89.3 04/17/2014   PLT 182 04/17/2014      Component Value Date/Time   NA 141 04/17/2014 0150   K 3.4* 04/17/2014 0150   CL 109 04/17/2014 0150   CO2 17* 04/17/2014 0150   GLUCOSE 110* 04/17/2014 0150   BUN 5* 04/17/2014 0150   CREATININE 0.84 04/17/2014 0150   CALCIUM 8.8 04/17/2014 0150   PROT 7.1 04/17/2014 0150   ALBUMIN 3.8 04/17/2014 0150   AST 14 04/17/2014 0150   ALT 13 04/17/2014 0150   ALKPHOS 61 04/17/2014 0150   BILITOT 0.4 04/17/2014 0150   GFRNONAA 88* 04/17/2014 0150   GFRAA >90 04/17/2014 0150       ASSESSMENT AND PLAN 39 y.o. year old female  has a past medical history of Seizures and Headache (04/29/2014). here with:  1. Seizures  The patient seizures have been  controlled on Keppra. She should continue this. The patient was feeling a little off balance today. She contributes this to her blood pressure. I have encouraged the patient to drink plenty of fluids. She should check her blood pressure periodically at home. If it remains low she should make her primary care provider aware. Patient verbalized understanding. She will follow-up in 6 months or sooner if needed.     Butch Penny, MSN, NP-C 01/07/2015, 11:40 AM [facility] [address], [address] [place], [place] [address] [phone]  Note: This document was prepared with digital dictation and possible smart phrase technology. Any transcriptional errors that result from this process are unintentional.

## 2015-07-14 ENCOUNTER — Ambulatory Visit (INDEPENDENT_AMBULATORY_CARE_PROVIDER_SITE_OTHER): Payer: Medicaid Other | Admitting: Neurology

## 2015-07-14 ENCOUNTER — Encounter: Payer: Self-pay | Admitting: Neurology

## 2015-07-14 VITALS — BP 107/67 | HR 78 | Ht 64.0 in | Wt 165.0 lb

## 2015-07-14 DIAGNOSIS — R569 Unspecified convulsions: Secondary | ICD-10-CM

## 2015-07-14 MED ORDER — LEVETIRACETAM 750 MG PO TABS: 750.0000 mg | ORAL_TABLET | Freq: Two times a day (BID) | ORAL | Status: DC
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
## 2015-07-14 NOTE — Patient Instructions (Signed)

## 2015-09-15 DIAGNOSIS — Z0271 Encounter for disability determination: Secondary | ICD-10-CM

## 2015-12-20 IMAGING — CR DG CHEST 2V
2 series · 2 of 2 positions shown · non-contrast
Comparison: Chest x-ray 06/23/2010.

CLINICAL DATA: Sharp chest pain and chest pressure.

EXAM:
CHEST  2 VIEW

[w chest pa]
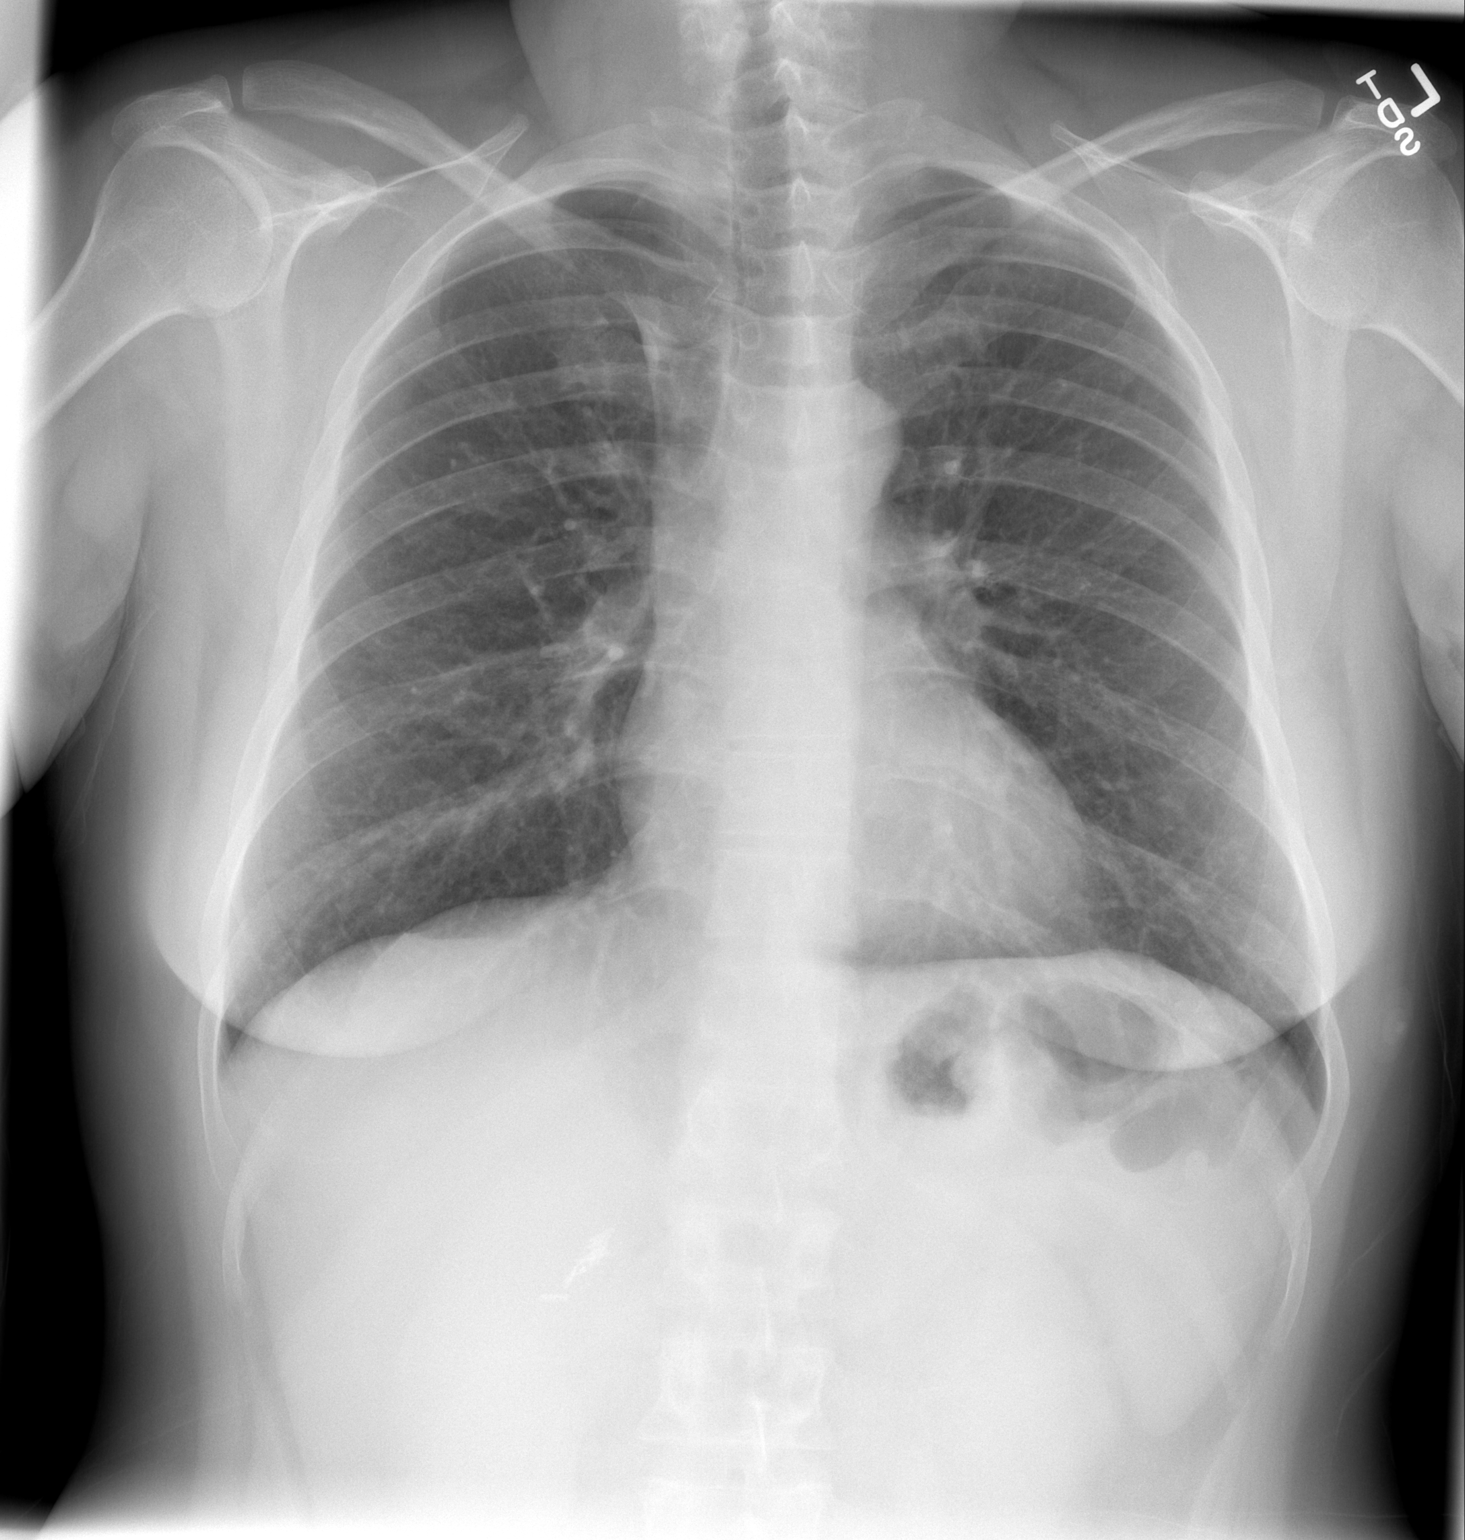

[w chest lat]
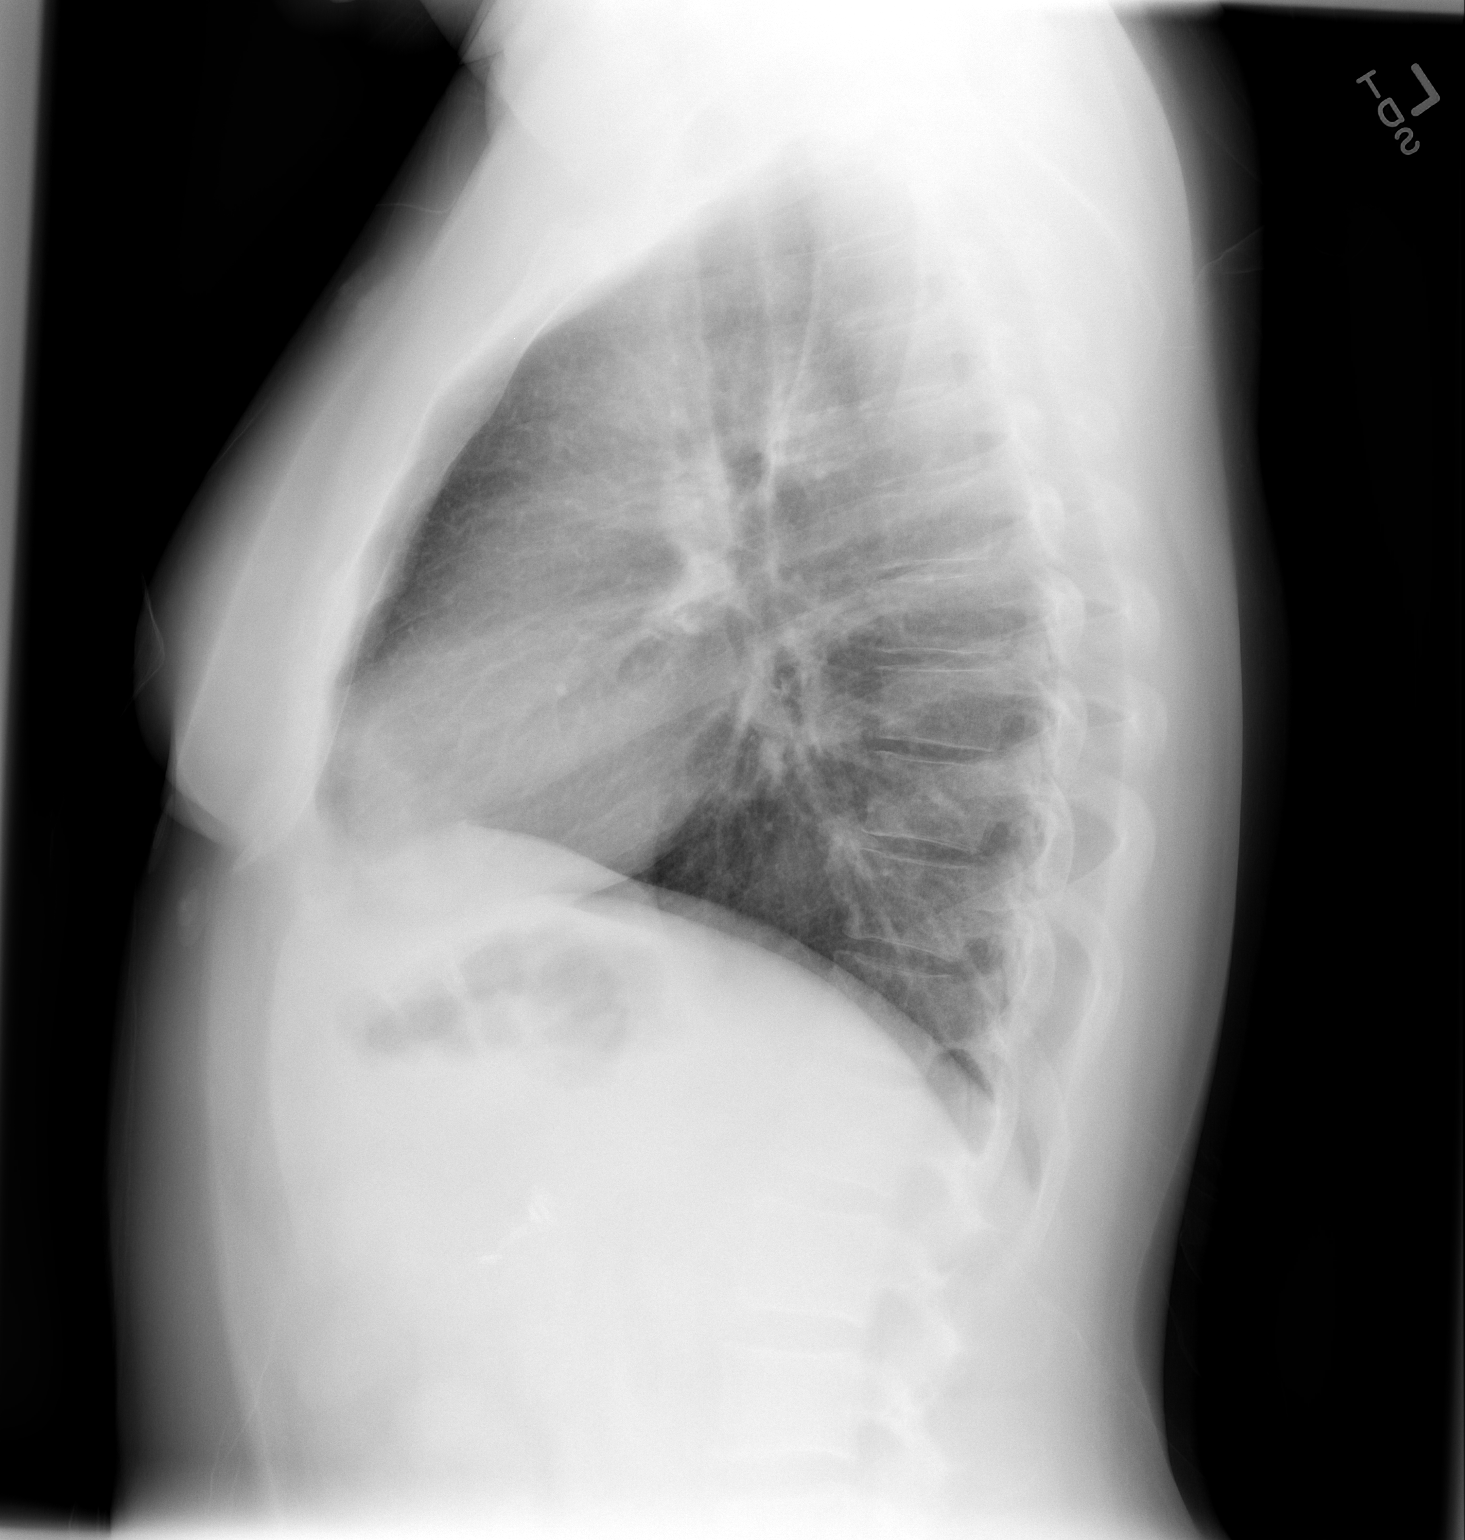

[2 of 2 positions shown; findings below may reference images not displayed]

FINDINGS: Lung volumes are normal. No consolidative airspace disease. No
pleural effusions. No pneumothorax. No pulmonary nodule or mass
noted. Pulmonary vasculature and the cardiomediastinal silhouette
are within normal limits. Azygos lobe (normal anatomical variant)
incidentally noted. Surgical clips project over the right upper
quadrant of the abdomen, likely from prior cholecystectomy.
IMPRESSION: No radiographic evidence of acute cardiopulmonary disease.

## 2016-01-12 ENCOUNTER — Telehealth: Payer: Self-pay

## 2016-01-12 ENCOUNTER — Ambulatory Visit: Payer: Self-pay | Admitting: Adult Health

## 2016-01-12 NOTE — Telephone Encounter (Signed)
Patient did not show appt.

## 2016-01-13 ENCOUNTER — Encounter: Payer: Self-pay | Admitting: Adult Health

## 2016-04-12 IMAGING — CR DG CHEST 1V PORT
1 series · 1 of 1 positions shown · non-contrast
Comparison: 12/23/2013

CLINICAL DATA: Altered mental status. Patient lost consciousness
today and fell to the ground. Smoker.

EXAM:
PORTABLE CHEST - 1 VIEW

[AP]
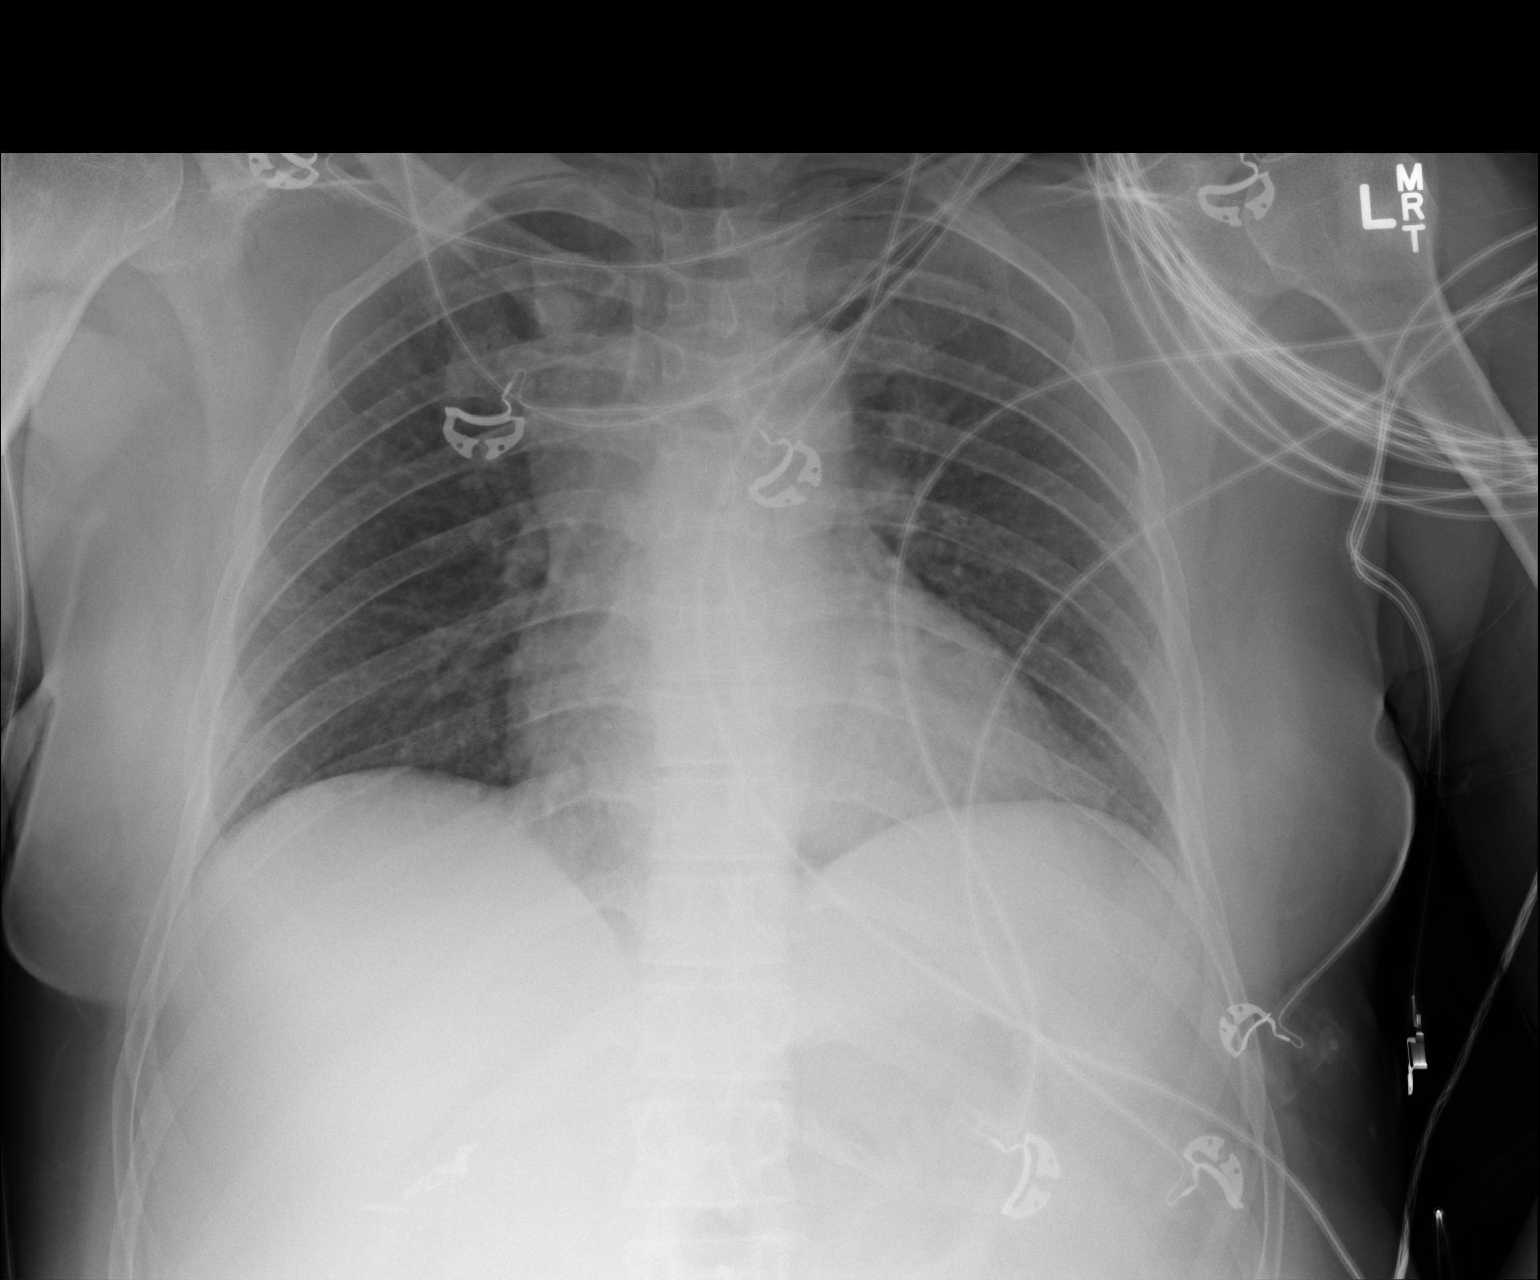

[1 of 1 positions shown; findings below may reference images not displayed]

FINDINGS: Shallow inspiration. The heart size and mediastinal contours are
within normal limits. Both lungs are clear. The visualized skeletal
structures are unremarkable.
IMPRESSION: No active disease.

## 2016-05-21 ENCOUNTER — Encounter (HOSPITAL_COMMUNITY): Payer: Self-pay

## 2016-05-21 ENCOUNTER — Inpatient Hospital Stay (HOSPITAL_COMMUNITY)
Admission: AD | Admit: 2016-05-21 | Discharge: 2016-05-25 | DRG: 885 | Disposition: A | Discharge status: Home / Self Care | Payer: Medicaid Other | Source: Intra-hospital | Attending: Psychiatry | Admitting: Psychiatry

## 2016-05-21 DIAGNOSIS — F339 Major depressive disorder, recurrent, unspecified: Principal | ICD-10-CM | POA: Diagnosis present

## 2016-05-21 DIAGNOSIS — Z9889 Other specified postprocedural states: Secondary | ICD-10-CM | POA: Diagnosis not present

## 2016-05-21 DIAGNOSIS — T50901A Poisoning by unspecified drugs, medicaments and biological substances, accidental (unintentional), initial encounter: Secondary | ICD-10-CM

## 2016-05-21 DIAGNOSIS — Z8489 Family history of other specified conditions: Secondary | ICD-10-CM | POA: Diagnosis not present

## 2016-05-21 DIAGNOSIS — F17210 Nicotine dependence, cigarettes, uncomplicated: Secondary | ICD-10-CM | POA: Diagnosis present

## 2016-05-21 DIAGNOSIS — G8929 Other chronic pain: Secondary | ICD-10-CM | POA: Diagnosis present

## 2016-05-21 DIAGNOSIS — R45851 Suicidal ideations: Secondary | ICD-10-CM | POA: Diagnosis present

## 2016-05-21 DIAGNOSIS — M549 Dorsalgia, unspecified: Secondary | ICD-10-CM | POA: Diagnosis present

## 2016-05-21 DIAGNOSIS — Z79899 Other long term (current) drug therapy: Secondary | ICD-10-CM | POA: Diagnosis not present

## 2016-05-21 DIAGNOSIS — T1491XA Suicide attempt, initial encounter: Secondary | ICD-10-CM | POA: Diagnosis not present

## 2016-05-21 DIAGNOSIS — Z818 Family history of other mental and behavioral disorders: Secondary | ICD-10-CM

## 2016-05-21 DIAGNOSIS — F332 Major depressive disorder, recurrent severe without psychotic features: Secondary | ICD-10-CM | POA: Diagnosis not present

## 2016-05-21 DIAGNOSIS — Z825 Family history of asthma and other chronic lower respiratory diseases: Secondary | ICD-10-CM

## 2016-05-21 DIAGNOSIS — F1190 Opioid use, unspecified, uncomplicated: Secondary | ICD-10-CM | POA: Diagnosis not present

## 2016-05-21 DIAGNOSIS — T426X2A Poisoning by other antiepileptic and sedative-hypnotic drugs, intentional self-harm, initial encounter: Secondary | ICD-10-CM | POA: Diagnosis present

## 2016-05-21 DIAGNOSIS — F1390 Sedative, hypnotic, or anxiolytic use, unspecified, uncomplicated: Secondary | ICD-10-CM | POA: Diagnosis not present

## 2016-05-21 DIAGNOSIS — Z9049 Acquired absence of other specified parts of digestive tract: Secondary | ICD-10-CM | POA: Diagnosis not present

## 2016-05-21 DIAGNOSIS — Z9851 Tubal ligation status: Secondary | ICD-10-CM | POA: Diagnosis not present

## 2016-05-21 MED ORDER — ACETAMINOPHEN 325 MG PO TABS
650.0000 mg | ORAL_TABLET | Freq: Four times a day (QID) | ORAL | Status: DC | PRN
  Administered 2016-05-22 – 2016-05-24 (×5): 650 mg via ORAL
  Filled 2016-05-21 (×5): qty 2

## 2016-05-21 MED ORDER — HYDROXYZINE HCL 25 MG PO TABS
25.0000 mg | ORAL_TABLET | Freq: Three times a day (TID) | ORAL | Status: DC | PRN
  Administered 2016-05-21 – 2016-05-22 (×2): 25 mg via ORAL
  Filled 2016-05-21 (×2): qty 1

## 2016-05-21 MED ORDER — ALUM & MAG HYDROXIDE-SIMETH 200-200-20 MG/5ML PO SUSP: 30.0000 mL | ORAL | Status: DC | PRN

## 2016-05-21 MED ORDER — MAGNESIUM HYDROXIDE 400 MG/5ML PO SUSP: 30.0000 mL | Freq: Every day | ORAL | Status: DC | PRN

## 2016-05-21 MED ORDER — NICOTINE 21 MG/24HR TD PT24
21.0000 mg | MEDICATED_PATCH | Freq: Every day | TRANSDERMAL | Status: DC
  Administered 2016-05-22 – 2016-05-25 (×4): 21 mg via TRANSDERMAL
  Filled 2016-05-21 (×6): qty 1

## 2016-05-21 MED ORDER — TRAZODONE HCL 50 MG PO TABS
50.0000 mg | ORAL_TABLET | Freq: Every evening | ORAL | Status: DC | PRN
  Filled 2016-05-21 (×9): qty 1

## 2016-05-21 NOTE — BH Assessment (Signed)
Tele Assessment Note   Martha SchleinCrystal Chen is an 40 y.o. female overdosed on 10 Gabapentin, Wednesday. Was unable to contract for safety. Stressors include the loss of a loved one, currently arguing with boyfriend, and a recent drug possession charge. Patietn has a court date on 06/25/15.  She is also on probahaiton for allowing her 8515 ry old daughter to miss too many days of school.  Denies HI. Occasionally has auditory and visual hallucination but not currently. 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 Previos inpatient several years ago and an overdose attempt. No outpatient treatment in several years, as well.  Patient was dressed in scrubs, alert, oriented, good eye contact, depressed mood and affect, cooperative, had impaired judgement, hopelessness, worthlessness, and was tearful. Patient was positive for cocaine, benzo and opiate.   Patient meets criteria for inpatient treatment.    Diagnosis: Major Depressive Disorder; cocaine use disorder, opioid use disorder  Past Medical History:  Past Medical History:  Diagnosis Date  . Headache 04/29/2014  . Seizures (HCC)     Past Surgical History:  Procedure Laterality Date  . BACK SURGERY    . CHOLECYSTECTOMY    . TUBAL LIGATION      Family History:  Family History  Problem Relation Age of Onset  . Liver disease Mother   . Sarcoidosis Mother   . Emphysema Father   . Seizures Neg Hx     Social History:  reports that she has been smoking Cigarettes.  She has been smoking about 1.00 pack per day. She has never used smokeless tobacco. She reports that she does not drink alcohol or use drugs.  Additional Social History:  Alcohol / Drug Use Pain Medications: see MAR Prescriptions: see MAR Over the Counter: see MAR  CIWA:   COWS:    PATIENT STRENGTHS: (choose at least two) Average or above average intelligence General fund of knowledge  Allergies: No Known Allergies  Home Medications:  (Not in a hospital  admission)  OB/GYN Status:  No LMP recorded.  General Assessment Data Location of Assessment: [facility] TTS Assessment: Out of system Is this a Tele or Face-to-Face Assessment?: Tele Assessment Is this an Initial Assessment or a Re-assessment for this encounter?: Initial Assessment Is patient pregnant?: No Pregnancy Status: No Living Arrangements: Children Can pt return to current living arrangement?: Yes Admission Status: Voluntary Is patient capable of signing voluntary admission?: Yes Referral Source: Self/Family/Friend Insurance type: MCD  Medical Screening Exam [facility] Walk-in ONLY) Medical Exam completed: Yes  Crisis Care Plan Living Arrangements: Children Name of Psychiatrist: n/a Name of Therapist: n/a  Education Status Is patient currently in school?: No  Risk to self with the past 6 months Suicidal Ideation: Yes-Currently Present Has patient been a risk to self within the past 6 months prior to admission? : No Suicidal Intent: Yes-Currently Present Has patient had any suicidal intent within the past 6 months prior to admission? : No Is patient at risk for suicide?: Yes Suicidal Plan?: Yes-Currently Present Has patient had any suicidal plan within the past 6 months prior to admission? : No Specify Current Suicidal Plan: patient overdosed Access to Means: Yes Specify Access to Suicidal Means: access to pills What has been your use of drugs/alcohol within the last 12 months?: cocaine, opiates, benzo Previous Attempts/Gestures: Yes How many times?: 1 Triggers for Past Attempts: Unknown Intentional Self Injurious Behavior: None Family Suicide History: Unknown Recent stressful life event(s): Loss (Comment), Financial Problems Persecutory voices/beliefs?: No Depression: Yes Depression  Symptoms: Tearfulness, Isolating, Feeling worthless/self pity Substance abuse history and/or treatment for substance abuse?: Yes Suicide prevention information given to  non-admitted patients: Not applicable  Risk to Others within the past 6 months Homicidal Ideation: No Does patient have any lifetime risk of violence toward others beyond the six months prior to admission? : No Thoughts of Harm to Others: No Current Homicidal Intent: No Current Homicidal Plan: No Access to Homicidal Means: No History of harm to others?: No Assessment of Violence: None Noted Does patient have access to weapons?: No Criminal Charges Pending?: No Does patient have a court date: No Is patient on probation?: Yes  Psychosis Hallucinations: Auditory Delusions: None noted  Mental Status Report Appearance/Hygiene: In hospital gown Eye Contact: Good Motor Activity: Unremarkable Speech: Logical/coherent Level of Consciousness: Alert Mood: Depressed Affect: Appropriate to circumstance Anxiety Level: Minimal Thought Processes: Coherent Judgement: Partial Orientation: Person, Place, Time, Situation Obsessive Compulsive Thoughts/Behaviors: None  Cognitive Functioning Concentration: Normal Memory: Recent Intact, Remote Intact IQ: Average Insight: Fair Impulse Control: Poor Appetite: Fair Sleep: No Change  ADLScreening [facility] Assessment Services) Patient's cognitive ability adequate to safely complete daily activities?: Yes Patient able to express need for assistance with ADLs?: Yes Independently performs ADLs?: Yes (appropriate for developmental age)  Prior Inpatient Therapy Prior Inpatient Therapy: Yes Prior Therapy Dates: 2014 Prior Therapy Facilty/Provider(s): unknown Reason for Treatment: depression, overdose  Prior Outpatient Therapy Prior Outpatient Therapy: Yes Prior Therapy Dates: 2 or 3 yrs ago Prior Therapy Facilty/Provider(s): [facility] Reason for Treatment: Depression Does patient have an ACCT team?: No Does patient have Intensive In-House Services?  : No Does patient have [facility] services? : No Does patient have P4CC services?: No  ADL  Screening (condition at time of admission) Patient's cognitive ability adequate to safely complete daily activities?: Yes Is the patient deaf or have difficulty hearing?: No Does the patient have difficulty seeing, even when wearing glasses/contacts?: No Does the patient have difficulty concentrating, remembering, or making decisions?: No Patient able to express need for assistance with ADLs?: Yes Does the patient have difficulty dressing or bathing?: No Independently performs ADLs?: Yes (appropriate for developmental age)       Abuse/Neglect Assessment (Assessment to be complete while patient is alone) Physical Abuse: Denies Verbal Abuse: Denies Sexual Abuse: Denies     Merchant navy officerAdvance Directives (For Healthcare) Does Patient Have a Medical Advance Directive?: No    Additional Information 1:1 In Past 12 Months?: No CIRT Risk: No Elopement Risk: No Does patient have medical clearance?: Yes     Disposition:  Disposition Initial Assessment Completed for this Encounter: Yes Disposition of Patient: Inpatient treatment program [facility] provider recommends inpatient) Type of inpatient treatment program: Adult  Westley Hummershley H Debe Anfinson 05/21/2016 1:03 PM

## 2016-05-21 NOTE — Tx Team (Signed)
Initial Treatment Plan 05/21/2016 11:04 PM Martha Schleinrystal Elahi ZOX:[id number]    PATIENT STRESSORS: Financial difficulties Substance abuse Traumatic event   PATIENT STRENGTHS: Capable of independent living Wellsite geologistCommunication skills General fund of knowledge Motivation for treatment/growth Supportive family/friends   PATIENT IDENTIFIED PROBLEMS: Depression  Anxiety  Suicidal ideation  Substance abuse  "I want to feel better"  "I want to be a normal person again"           DISCHARGE CRITERIA:  Improved stabilization in mood, thinking, and/or behavior Verbal commitment to aftercare and medication compliance Withdrawal symptoms are absent or subacute and managed without 24-hour nursing intervention  PRELIMINARY DISCHARGE PLAN: Outpatient therapy Medication management  PATIENT/FAMILY INVOLVEMENT: This treatment plan has been presented to and reviewed with the patient, Martha Chen.  The patient and family have been given the opportunity to ask questions and make suggestions.  Levin BaconHeather V Dexter Sauser, RN 05/21/2016, 11:04 PM
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
## 2016-05-21 NOTE — Progress Notes (Signed)
Psychoeducational Group Note  Date:  05/21/2016 Time:  2145  Group Topic/Focus:  Wrap-Up Group:   The focus of this group is to help patients review their daily goal of treatment and discuss progress on daily workbooks.   Participation Level: Did Not Attend  Participation Quality:  Not Applicable  Affect:  Not Applicable  Cognitive:  Not Applicable  Insight:  Not Applicable  Engagement in Group: Not Applicable  Additional Comments:  The patient attended the last few minutes of group and had nothing to share.    Hazle CocaGOODMAN, Sharonann Malbrough S 05/21/2016, 9:45 PM

## 2016-05-22 DIAGNOSIS — T50901A Poisoning by unspecified drugs, medicaments and biological substances, accidental (unintentional), initial encounter: Secondary | ICD-10-CM

## 2016-05-22 MED ORDER — LORAZEPAM 1 MG PO TABS: 1.0000 mg | ORAL_TABLET | Freq: Four times a day (QID) | ORAL | Status: AC | PRN

## 2016-05-22 MED ORDER — ADULT MULTIVITAMIN W/MINERALS CH
1.0000 | ORAL_TABLET | Freq: Every day | ORAL | Status: DC
  Administered 2016-05-22 – 2016-05-25 (×4): 1 via ORAL
  Filled 2016-05-22 (×5): qty 1

## 2016-05-22 MED ORDER — CITALOPRAM HYDROBROMIDE 10 MG PO TABS
ORAL_TABLET | ORAL | Status: AC
  Filled 2016-05-22: qty 1

## 2016-05-22 MED ORDER — CITALOPRAM HYDROBROMIDE 10 MG PO TABS
10.0000 mg | ORAL_TABLET | Freq: Every day | ORAL | Status: DC
  Administered 2016-05-22 – 2016-05-25 (×4): 10 mg via ORAL
  Filled 2016-05-22 (×5): qty 1

## 2016-05-22 MED ORDER — DICYCLOMINE HCL 20 MG PO TABS
20.0000 mg | ORAL_TABLET | Freq: Four times a day (QID) | ORAL | Status: DC | PRN
Start: 2016-05-22 — End: 2016-05-25

## 2016-05-22 MED ORDER — CLONIDINE HCL 0.1 MG PO TABS: 0.1000 mg | ORAL_TABLET | Freq: Every day | ORAL | Status: DC

## 2016-05-22 MED ORDER — TRAZODONE HCL 50 MG PO TABS: 50.0000 mg | ORAL_TABLET | Freq: Every evening | ORAL | Status: DC | PRN

## 2016-05-22 MED ORDER — NAPROXEN 500 MG PO TABS
500.0000 mg | ORAL_TABLET | Freq: Two times a day (BID) | ORAL | Status: DC | PRN
  Administered 2016-05-23 – 2016-05-24 (×4): 500 mg via ORAL
  Filled 2016-05-22 (×4): qty 1

## 2016-05-22 MED ORDER — VITAMIN B-1 100 MG PO TABS
100.0000 mg | ORAL_TABLET | Freq: Every day | ORAL | Status: DC
  Administered 2016-05-23 – 2016-05-25 (×3): 100 mg via ORAL
  Filled 2016-05-22 (×5): qty 1

## 2016-05-22 MED ORDER — CLONIDINE HCL 0.1 MG PO TABS
0.1000 mg | ORAL_TABLET | ORAL | Status: DC
  Administered 2016-05-24: 0.1 mg via ORAL
  Filled 2016-05-22 (×5): qty 1

## 2016-05-22 MED ORDER — ONDANSETRON 4 MG PO TBDP
4.0000 mg | ORAL_TABLET | Freq: Four times a day (QID) | ORAL | Status: DC | PRN
Start: 2016-05-22 — End: 2016-05-22

## 2016-05-22 MED ORDER — ONDANSETRON 4 MG PO TBDP
4.0000 mg | ORAL_TABLET | Freq: Four times a day (QID) | ORAL | Status: AC | PRN
Start: 2016-05-22 — End: 2016-05-25

## 2016-05-22 MED ORDER — HYDROXYZINE HCL 25 MG PO TABS
25.0000 mg | ORAL_TABLET | Freq: Four times a day (QID) | ORAL | Status: AC | PRN
  Administered 2016-05-22 – 2016-05-24 (×6): 25 mg via ORAL
  Filled 2016-05-22 (×6): qty 1

## 2016-05-22 MED ORDER — METHOCARBAMOL 500 MG PO TABS
500.0000 mg | ORAL_TABLET | Freq: Three times a day (TID) | ORAL | Status: DC | PRN
  Administered 2016-05-23 – 2016-05-24 (×3): 500 mg via ORAL
  Filled 2016-05-22 (×3): qty 1

## 2016-05-22 MED ORDER — CLONIDINE HCL 0.1 MG PO TABS
0.1000 mg | ORAL_TABLET | Freq: Four times a day (QID) | ORAL | Status: AC
  Administered 2016-05-22 – 2016-05-24 (×6): 0.1 mg via ORAL
  Filled 2016-05-22 (×10): qty 1

## 2016-05-22 MED ORDER — LOPERAMIDE HCL 2 MG PO CAPS: 2.0000 mg | ORAL_CAPSULE | ORAL | Status: DC | PRN

## 2016-05-22 MED ORDER — HYDROXYZINE HCL 25 MG PO TABS
ORAL_TABLET | ORAL | Status: AC
  Filled 2016-05-22: qty 1

## 2016-05-22 MED ORDER — ADULT MULTIVITAMIN W/MINERALS CH
ORAL_TABLET | ORAL | Status: AC
  Filled 2016-05-22: qty 1

## 2016-05-22 MED ORDER — THIAMINE HCL 100 MG/ML IJ SOLN: 100.0000 mg | Freq: Once | INTRAMUSCULAR | Status: DC

## 2016-05-22 MED ORDER — LOPERAMIDE HCL 2 MG PO CAPS: 2.0000 mg | ORAL_CAPSULE | ORAL | Status: AC | PRN

## 2016-05-22 NOTE — [facility] Group Notes (Signed)
Adult Psychoeducational Group Note  Date:  05/22/2016 Time:  8:30 PM  Group Topic/Focus:  Wrap-Up Group:   The focus of this group is to help patients review their daily goal of treatment and discuss progress on daily workbooks.   Participation Level:  Active  Participation Quality:  Appropriate  Affect:  Appropriate  Cognitive:  Alert  Insight: Good  Engagement in Group:  Engaged  Modes of Intervention:  Discussion  Additional Comments:  Patient reported wanting to be a better person.  Patient confirmed she felt comfortable with talking to others.    Elmore GuiseSLOAN, Monty Spicher N 05/22/2016, 10:28 PM

## 2016-05-22 NOTE — [facility] Group Notes (Signed)
Nursing Psycho-educational Group:   Patient attended and actively participated in nursing group. Cooperative and engaged. 
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
## 2016-05-22 NOTE — H&P (Signed)
Psychiatric Admission Assessment Adult  Patient Identification: Martha Chen MRN:  [id number] Date of Evaluation:  05/22/2016 Chief Complaint:  MDD POLYSUBSTANCE ABUSE Principal Diagnosis: Overdose Diagnosis:   Patient Active Problem List   Diagnosis Date Noted  . Overdose [T50.901A] 05/22/2016  . Major depression, recurrent (HCC) [F33.9] 05/21/2016  . Seizures (HCC) [R56.9]   . Headache [R51] 04/29/2014  . Seizure (HCC) [R56.9] 04/17/2014  . Smoking [F17.200] 04/17/2014  . Nausea and vomiting [R11.2] 04/17/2014  . Metabolic acidosis [E87.2] 04/17/2014  . Leukocytosis [D72.829] 04/17/2014  . Hypokalemia [E87.6] 04/17/2014   History of Present Illness:Per Teleassessment Note -Martha SchleinCrystal Mangan is an 40 y.o. female overdosed on 10 Gabapentin, Wednesday. Was unable to contract for safety. Stressors include the loss of a loved one, currently arguing with boyfriend, and a recent drug possession charge. Patietn has a court date on 06/25/15.  She is also on probahaiton for allowing her 3215 ry old daughter to miss too many days of school.  Denies HI. Occasionally has auditory and visual hallucination but not currently. 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 Previos inpatient several years ago and an overdose attempt. No outpatient treatment in several years, as well. Patient was dressed in scrubs, alert, oriented, good eye contact, depressed mood and affect, cooperative, had impaired judgement, hopelessness, worthlessness, and was tearful. Patient was positive for cocaine, benzo and opiate  On Evaluation:Martha Chen is awake, alert and oriented *3. seen attending group session.  Denies suicidal or homicidal ideation. Denies auditory or visual hallucination and does not appear to be responding to internal stimuli.patient vailated the information that was provided in the HPI. Patient reports she is has chronic back pain which is why she self medicates. Reports she is going to try  and get it together for her grand kids. Support, encouragement and reassurance was provided.   Associated Signs/Symptoms: Depression Symptoms:  depressed mood, hopelessness, suicidal thoughts without plan, (Hypo) Manic Symptoms:  Impulsivity, Irritable Mood, Anxiety Symptoms:  Excessive Worry, Psychotic Symptoms:  Hallucinations: None PTSD Symptoms: Avoidance:  Decreased Interest/Participation Total Time spent with patient: 30 minutes  Past Psychiatric History:   Is the patient at risk to self? Yes.    Has the patient been a risk to self in the past 6 months? Yes.    Has the patient been a risk to self within the distant past? Yes.    Is the patient a risk to others? No.  Has the patient been a risk to others in the past 6 months? No.  Has the patient been a risk to others within the distant past? No.   Prior Inpatient Therapy: Prior Inpatient Therapy: Yes Prior Therapy Dates: 2014 Prior Therapy Facilty/Provider(s): unknown Reason for Treatment: depression, overdose Prior Outpatient Therapy: Prior Outpatient Therapy: Yes Prior Therapy Dates: 2 or 3 yrs ago Prior Therapy Facilty/Provider(s): [facility] Reason for Treatment: Depression Does patient have an ACCT team?: No Does patient have Intensive In-House Services?  : No Does patient have [facility] services? : No Does patient have P4[facility] services?: No  Alcohol Screening: 1. How often do you have a drink containing alcohol?: Never 9. Have you or someone else been injured as a result of your drinking?: No 10. Has a relative or friend or a doctor or another health worker been concerned about your drinking or suggested you cut down?: No Alcohol Use Disorder Identification Test Final Score (AUDIT): 0 Brief Intervention: AUDIT score less than 7 or less-screening does not suggest unhealthy drinking-brief intervention not indicated Substance  Abuse History in the last 12 months:  Yes.   Consequences of Substance Abuse: Withdrawal Symptoms:    Headaches Previous Psychotropic Medications: YES Psychological Evaluations: YES Past Medical History:  Past Medical History:  Diagnosis Date  . Headache 04/29/2014  . Seizures (HCC)     Past Surgical History:  Procedure Laterality Date  . BACK SURGERY    . CHOLECYSTECTOMY    . TUBAL LIGATION     Family History:  Family History  Problem Relation Age of Onset  . Liver disease Mother   . Sarcoidosis Mother   . Emphysema Father   . Seizures Neg Hx    Family Psychiatric  History: Father : PTSD and depression Tobacco Screening: Have you used any form of tobacco in the last 30 days? (Cigarettes, Smokeless Tobacco, Cigars, and/or Pipes): Yes Tobacco use, Select all that apply: 5 or more cigarettes per day Are you interested in Tobacco Cessation Medications?: Yes, will notify MD for an order Counseled patient on smoking cessation including recognizing danger situations, developing coping skills and basic information about quitting provided: Refused/Declined practical counseling Social History:  History  Alcohol Use No     History  Drug Use  . Types: Oxycodone, Benzodiazepines    Comment: oxy 60mg  daily and xanax 2mg  daily    Additional Social History:      Pain Medications: see MAR Prescriptions: see MAR Over the Counter: see MAR History of alcohol / drug use?: Yes Longest period of sobriety (when/how long): just started using earlier this year Negative Consequences of Use: Financial, Legal, Personal relationships Withdrawal Symptoms: Other (Comment) ("I don't know") Name of Substance 1: cocaine 1 - Age of First Use: this year 1 - Amount (size/oz): unsure 1 - Frequency: 2-3 times per week 1 - Duration: for months 1 - Last Use / Amount: "can't remember" Name of Substance 2: xanax 2 - Age of First Use: this year 2 - Amount (size/oz): 2mg  2 - Frequency: daily 2 - Duration: months 2 - Last Use / Amount: yesterday Name of Substance 3: Oxycodone 3 - Age of First Use:  this year 3 - Amount (size/oz): 60mg   3 - Frequency: daily 3 - Duration: for months 3 - Last Use / Amount: yesterday              Allergies:  No Known Allergies Lab Results: No results found for this or any previous visit (from the past 48 hour(s)).  Blood Alcohol level:  Lab Results  Component Value Date   [facility] <11 04/16/2014    Metabolic Disorder Labs:  No results found for: HGBA1C, MPG No results found for: PROLACTIN No results found for: CHOL, TRIG, HDL, CHOLHDL, VLDL, LDLCALC  Current Medications: Current Facility-Administered Medications  Medication Dose Route Frequency Provider Last Rate Last Dose  . acetaminophen (TYLENOL) tablet 650 mg  650 mg Oral Q6H PRN Jackelyn Poling, NP   650 mg at 05/22/16 0826  . alum & mag hydroxide-simeth (MAALOX/MYLANTA) 200-200-20 MG/5ML suspension 30 mL  30 mL Oral Q4H PRN Jackelyn Poling, NP      . citalopram (CELEXA) tablet 10 mg  10 mg Oral Daily Rockey Situ Cobos, MD      . cloNIDine (CATAPRES) tablet 0.1 mg  0.1 mg Oral QID Jackelyn Poling, NP   0.1 mg at 05/22/16 1151   Followed by  . [START ON 05/24/2016] cloNIDine (CATAPRES) tablet 0.1 mg  0.1 mg Oral BH-qamhs Jackelyn Poling, NP  Followed by  . [START ON 05/27/2016] cloNIDine (CATAPRES) tablet 0.1 mg  0.1 mg Oral QAC breakfast Jackelyn Poling, NP      . dicyclomine (BENTYL) tablet 20 mg  20 mg Oral Q6H PRN Jackelyn Poling, NP      . hydrOXYzine (ATARAX/VISTARIL) tablet 25 mg  25 mg Oral Q6H PRN Craige Cotta, MD      . loperamide (IMODIUM) capsule 2-4 mg  2-4 mg Oral PRN Craige Cotta, MD      . LORazepam (ATIVAN) tablet 1 mg  1 mg Oral Q6H PRN Rockey Situ Cobos, MD      . magnesium hydroxide (MILK OF MAGNESIA) suspension 30 mL  30 mL Oral Daily PRN Jackelyn Poling, NP      . methocarbamol (ROBAXIN) tablet 500 mg  500 mg Oral Q8H PRN Jackelyn Poling, NP      . multivitamin with minerals tablet 1 tablet  1 tablet Oral Daily Rockey Situ Cobos, MD      . naproxen (NAPROSYN) tablet 500 mg   500 mg Oral BID PRN Jackelyn Poling, NP      . nicotine (NICODERM CQ - dosed in mg/24 hours) patch 21 mg  21 mg Transdermal Daily Craige Cotta, MD   21 mg at 05/22/16 1610  . ondansetron (ZOFRAN-ODT) disintegrating tablet 4 mg  4 mg Oral Q6H PRN Craige Cotta, MD      . thiamine (B-1) injection 100 mg  100 mg Intramuscular Once Craige Cotta, MD      . Melene Muller ON 05/23/2016] thiamine (VITAMIN B-1) tablet 100 mg  100 mg Oral Daily Rockey Situ Cobos, MD      . traZODone (DESYREL) tablet 50 mg  50 mg Oral QHS PRN Craige Cotta, MD       PTA Medications: No prescriptions prior to admission.    Musculoskeletal: Strength & Muscle Tone: within normal limits Gait & Station: normal Patient leans: N/A  Psychiatric Specialty Exam: Physical Exam  Nursing note and vitals reviewed. Constitutional: She is oriented to person, place, and time.  Neurological: She is alert and oriented to person, place, and time.  Psychiatric: She has a normal mood and affect. Her behavior is normal.    Review of Systems  Psychiatric/Behavioral: Positive for depression, substance abuse and suicidal ideas. The patient is nervous/anxious.     Blood pressure (!) 106/59, pulse (!) 55, temperature 97.7 F (36.5 C), resp. rate 16, height 5\' 4"  (1.626 m), weight 68.3 kg (150 lb 8 oz), last menstrual period 05/20/2016, SpO2 98 %.Body mass index is 25.83 kg/m.  General Appearance: Casual  Eye Contact:  Good  Speech:  Clear and Coherent missing teeth noted  Volume:  Normal  Mood:  Anxious and Depressed  Affect:  Congruent  Thought Process:  Linear  Orientation:  Full (Time, Place, and Person)  Thought Content:  Hallucinations: None  Suicidal Thoughts:  No  Homicidal Thoughts:  No  Memory:  Immediate;   Fair Recent;   Fair Remote;   Fair  Judgement:  Intact  Insight:  Present  Psychomotor Activity:  Normal  Concentration:  Concentration: Fair  Recall:  [employer] of Knowledge:  Fair  Language:  Fair   Akathisia:  No  Handed:  Right  AIMS (if indicated):     Assets:  Communication Skills Desire for Improvement Resilience Social Support  ADL's:  Intact  Cognition:  WNL  Sleep:  Number of Hours: 5.25  I agree with current treatment plan on 05/22/2016, Patient seen face-to-face for psychiatric evaluation follow-up, chart reviewed and case discussed with the MD Cobos, . Reviewed the information documented and agree with the treatment plan.  Treatment Plan Summary: Daily contact with patient to assess and evaluate symptoms and progress in treatment and Medication management   Continue with Celexa 10 mg  for mood stabilization. Continue with Trazodone 50 mg for insomnia Started on COWs/ clonidine Protocol Will continue to monitor vitals ,medication compliance and treatment side effects while patient is here.  CSW will start working on disposition.  Patient to participate in therapeutic milieu   Observation Level/Precautions:  15 minute checks  Laboratory:  CBC Chemistry Profile UDS UA  Psychotherapy:  Individual and group session  Medications:  See above  Consultations:  Psychiatry  Discharge Concerns:  Safety, stabilization, and risk of access to medication and medication stabilization   Estimated LOS: 5-7days  Other:     Physician Treatment Plan for Primary Diagnosis: Overdose Long Term Goal(s): Improvement in symptoms so as ready for discharge  Short Term Goals: Ability to identify changes in lifestyle to reduce recurrence of condition will improve, Ability to verbalize feelings will improve and Ability to identify and develop effective coping behaviors will improve  Physician Treatment Plan for Secondary Diagnosis: Principal Problem:   Overdose Active Problems:   Major depression, recurrent (HCC)  Long Term Goal(s): Improvement in symptoms so as ready for discharge  Short Term Goals: Ability to disclose and discuss suicidal ideas, Ability to demonstrate  self-control will improve and Compliance with prescribed medications will improve  I certify that inpatient services furnished can reasonably be expected to improve the patient's condition.    Oneta Rackanika N Lewis, NP 12/23/20172:48 PM  Patient case reviewed with NP and patient seen by me Agree with NP note and assessment  Patient is a 40 year old female. She lives with two daughters ( 23,16)  She overdosed on about 10 Neurontins on 12/20. States overdose was impulsive, unplanned. States that after overdose she fell so daughter called 911. At this time she states she does not think the overdose was related to suicidal intent.States " my back was hurting a lot and I just wanted some relief".  She does reports recent sense of sadness, depression and anxiety, which she attributes to several different stressors, states his boyfriend passed away last year, step-mother passed away earlier this year, and has upcoming court date for drug related charge. States she has been abusing cocaine and roxycodone ( up to 60 mgrs )  "almost daily", xanax " about two tablets a few times a week". She last used these substances on 12/19. She states she did attempt suicide by overdose 5 years,  has had had one prior psychiatric admission for this event.  Dx- Overdose, undetermined intent. Depression, consider substance induced mood disorder, polysubstance abuse ( cocaine, opiates, benzodiazepines Plan - inpatient admission- started on Celexa 10 mgrs QDAY for depression and anxiety Clonidine detox protocol to minimize risk of opaiate WDL. Ativan detox protocol ( PRN) to minimize risk of BZD witdhrawal.

## 2016-05-22 NOTE — Progress Notes (Signed)
Data. Patient denies SI/HI/AVH. Patient interacting well with staff and other patients. Affect continues to be blunt, but brightens with interaction. BP has been rending down this shift and 5pm clonidine was held due to this. Patient has been reporting some subjective symptoms of detox including head ache and tremors. Tremors not palpable. On her slf assessment patient reported 0/10 for depression and hopelessness and 5/10 for anxiety. Her goal for today is: "To get better to be home for Christmas".  Action. Emotional support and encouragement offered. Education provided on medication, indications and side effect. Q 15 minute checks done for safety. Response. Safety on the unit maintained through 15 minute checks.  Medications taken as prescribed. Attended groups. Remained calm and appropriate through out shift.

## 2016-05-22 NOTE — BHH Group Notes (Signed)
Adult Therapy Group Note  Date:  05/22/2016  Time:  10:00-11:00AM  Group Topic/Focus: Coping with the Holidays  The focus of this group was to share good and bad holiday memories and process how this has affected this holiday season for patients.  Participation Level:  Minimal  Participation Quality:  Attentive  Affect:  Blunted  Cognitive:  N/A  Insight: Limited  Engagement in Group:  Poor  Modes of Intervention:  Exercise, Discussion and Support  Additional Comments:  The patient expressed only that she did not want to talk, wanted only to listen.  Martha MantleMareida Grossman-Orr, LCSW 05/22/2016   12:30 PM

## 2016-05-22 NOTE — Plan of Care (Signed)
Problem: Education: Goal: Knowledge of the prescribed therapeutic regimen will improve Outcome: Progressing Education provided on her medications indication and schedules.

## 2016-05-22 NOTE — [facility] Suicide Risk Assessment (Signed)
[facility]   Nursing information obtained from:  Patient Demographic factors:  Divorced or widowed, Caucasian, Low socioeconomic status Current Mental Status:  NA Loss Factors:  Legal issues, Decline in physical health, Financial problems / change in socioeconomic status, Loss of significant relationship Historical Factors:  Family history of mental illness or substance abuse Risk Reduction Factors:  Living with another person, especially a relative  Total Time spent with patient: 45 minutes Principal Problem:  Suicidal Attempt, Depression Diagnosis:   Patient Active Problem List   Diagnosis Date Noted  . Major depression, recurrent (HCC) [F33.9] 05/21/2016  . Seizures (HCC) [R56.9]   . Headache [R51] 04/29/2014  . Seizure (HCC) [R56.9] 04/17/2014  . Smoking [F17.200] 04/17/2014  . Nausea and vomiting [R11.2] 04/17/2014  . Metabolic acidosis [E87.2] 04/17/2014  . Leukocytosis [D72.829] 04/17/2014  . Hypokalemia [E87.6] 04/17/2014     Continued Clinical Symptoms:  Alcohol Use Disorder Identification Test Final Score (AUDIT): 0 The "Alcohol Use Disorders Identification Test", Guidelines for Use in Primary Care, Second Edition.  World Science writerHealth Organization [facility](WHO). Score between 0-7:  no or low risk or alcohol related problems. Score between 8-15:  moderate risk of alcohol related problems. Score between 16-19:  high risk of alcohol related problems. Score 20 or above:  warrants further diagnostic evaluation for alcohol dependence and treatment.   CLINICAL FACTORS:  Patient is a 40 year old female. She lives with two daughters ( 23,16)  She overdosed on about 10 Neurontins on 12/20. States overdose was impulsive, unplanned. States that after overdose she fell so daughter called 911. At this time she states she does not think the overdose was related to suicidal intent.States " my back was hurting a lot and I just wanted some relief".  She does reports recent  sense of sadness, depression and anxiety, which she attributes to several different stressors, states his boyfriend passed away last year, step-mother passed away earlier this year, and has upcoming court date for drug related charge. States she has been abusing cocaine and roxycodone ( up to 60 mgrs )  "almost daily", xanax " about two tablets a few times a week". She last used these substances on 12/19. She states she did attempt suicide by overdose 5 years,  has had had one prior psychiatric admission for this event.  Dx- Overdose, undetermined intent. Depression, consider substance induced mood disorder, polysubstance abuse ( cocaine, opiates, benzodiazepines Plan - inpatient admission- started on Celexa 10 mgrs QDAY for depression and anxiety Clonidine detox protocol to minimize risk of opaiate WDL. Ativan detox protocol ( PRN) to minimize risk of BZD witdhrawal.   Musculoskeletal: Strength & Muscle Tone: within normal limits Gait & Station: normal Patient leans: N/A  Psychiatric Specialty Exam: Physical Exam  ROS describes mild headache, no visual disturbances, denies chest pain, no shortness of breath, no nausea, no vomiting, (+) diarrhea, improving, denies dysuria or urgency, no fever, no chills  Blood pressure (!) 106/59, pulse (!) 55, temperature 97.7 F (36.5 C), resp. rate 16, height 5\' 4"  (1.626 m), weight 68.3 kg (150 lb 8 oz), last menstrual period 05/20/2016, SpO2 98 %.Body mass index is 25.83 kg/m.  General Appearance: Fairly Groomed  Eye Contact:  Fair  Speech:  Normal Rate  Volume:  Normal  Mood:  states she is feeling better, minimizes depression at this time  Affect:  reactive, smiles appropriately at times   Thought Process:  Linear  Orientation:  Full (Time, Place, and Person)  Thought Content:  denies any hallucinations and does not appear internally preoccupied , no delusions expressed   Suicidal Thoughts:  No denies any suicidal or self injurious ideations,  denies any homicidal or violent ideations   Homicidal Thoughts:  No  Memory:  recent and remote grossly intact   Judgement:  Fair  Insight:  Fair  Psychomotor Activity:  Normal no restlessness, no tremors, no diaphoresis, no psychomotor agitation  Concentration:  Concentration: Good and Attention Span: Good  Recall:  Good  Fund of Knowledge:  Good  Language:  Negative  Akathisia:  Negative  Handed:  Right  AIMS (if indicated):     Assets:  Desire for Improvement Resilience Social Support  ADL's:  Intact  Cognition:  WNL  Sleep:  Number of Hours: 5.25      COGNITIVE FEATURES THAT CONTRIBUTE TO RISK:  Closed-mindedness and Loss of executive function    SUICIDE RISK:   Moderate:  Frequent suicidal ideation with limited intensity, and duration, some specificity in terms of plans, no associated intent, good self-control, limited dysphoria/symptomatology, some risk factors present, and identifiable protective factors, including available and accessible social support.   PLAN OF CARE: Patient will be admitted to inpatient psychiatric unit for stabilization and safety. Will provide and encourage milieu participation. Provide medication management and maked adjustments as needed.  Will follow daily.    I certify that inpatient services furnished can reasonably be expected to improve the patient's condition.  Nehemiah MassedOBOS, FERNANDO, MD 05/22/2016, 1:49 PM

## 2016-05-22 NOTE — Progress Notes (Signed)
D. Pt is new admission to unit.  Pt denies complaints at this time, no current signs or symptoms of withdrawal noted.  Pt did attend the last bit of wrap up group, minimal interaction on the unit as she is new.  Pt denies SI/HI/hallucinations at this time.  A.  Support and encouragement offered, medication given as ordered.  Sleep medication refused at this time.  R.  Pt remains safe on the unit, will continue to monitor.

## 2016-05-23 MED ORDER — INFLUENZA VAC SPLIT QUAD 0.5 ML IM SUSY
0.5000 mL | PREFILLED_SYRINGE | INTRAMUSCULAR | Status: AC
  Administered 2016-05-24: 0.5 mL via INTRAMUSCULAR
  Filled 2016-05-23: qty 0.5

## 2016-05-23 NOTE — Progress Notes (Signed)
Writer has observed Martha Chen up in the dayroom watching tv and interacting appropriately with peers. She was in formed of her medications and encouraged to take in plenty of fluids with her pulse being low. She was compliant with her medication. [facility]. Safety maintained on unit with 15 min checks.

## 2016-05-23 NOTE — [facility] Group Notes (Signed)
The focus of this group is to educate the patient on the purpose and policies of crisis stabilization and provide a format to answer questions about their admission.  The group details unit policies and expectations of patients while admitted.  Patient did not attend nurse education orientation group this morning.  Patient stayed in room.  

## 2016-05-23 NOTE — Progress Notes (Signed)
[facility] MD Progress Note  05/23/2016 2:26 PM Martha Chen  MRN:  [id number] Subjective:  Patient reports she is feeling better. At this time denies any suicidal or self injurious ideations . Denies medication side effects. She denies any significant withdrawal symptoms. Objective : I have discussed case with treatment team and have met with patient . Patient presents improved compared to admission- presents with partially improved mood and range of affect, although still somewhat constricted . She has been visible on unit, going to groups, no disruptive or agitated behaviors . She is tolerating medications well , denies side effects. Tolerating Celexa trial well. No distal tremors, no diaphoresis, no psychomotor agitation or restlessness. Vitals stable. As she improves she is focusing on disposition and on being discharged soon in order to return to her family for the holidays.  Principal Problem: Overdose Diagnosis:   Patient Active Problem List   Diagnosis Date Noted  . Overdose [T50.901A] 05/22/2016  . Major depression, recurrent ([place]) [F33.9] 05/21/2016  . Seizures ([place]) [R56.9]   . Headache [R51] 04/29/2014  . Seizure ([place]) [R56.9] 04/17/2014  . Smoking [F17.200] 04/17/2014  . Nausea and vomiting [R11.2] 04/17/2014  . Metabolic acidosis [L89.3] 04/17/2014  . Leukocytosis [D72.829] 04/17/2014  . Hypokalemia [E87.6] 04/17/2014   Total Time spent with patient: 20 minutes  Past Medical History:  Past Medical History:  Diagnosis Date  . Headache 04/29/2014  . Seizures ([place])     Past Surgical History:  Procedure Laterality Date  . BACK SURGERY    . CHOLECYSTECTOMY    . TUBAL LIGATION     Family History:  Family History  Problem Relation Age of Onset  . Liver disease Mother   . Sarcoidosis Mother   . Emphysema Father   . Seizures Neg Hx     Social History:  History  Alcohol Use No     History  Drug Use  . Types: Oxycodone, Benzodiazepines    Comment: oxy 22m  daily and xanax 261mdaily    Social History   Social History  . Marital status: Married    Spouse name: N/A  . Number of children: 2  . Years of education: 7th grade   Social History Main Topics  . Smoking status: Current Every Day Smoker    Packs/day: 1.00    Types: Cigarettes  . Smokeless tobacco: Never Used  . Alcohol use No  . Drug use:     Types: Oxycodone, Benzodiazepines     Comment: oxy 6081maily and xanax 2mg40mily  . Sexual activity: Not Asked   Other Topics Concern  . None   Social History Narrative   Patient is separated    Patient is right handed.   Patient drinks 3-4 sodas daily.      Additional Social History:    Pain Medications: see MAR Prescriptions: see MAR Over the Counter: see MAR History of alcohol / drug use?: Yes Longest period of sobriety (when/how long): just started using earlier this year Negative Consequences of Use: Financial, Legal, Personal relationships Withdrawal Symptoms: Other (Comment) ("I don't know") Name of Substance 1: cocaine 1 - Age of First Use: this year 1 - Amount (size/oz): unsure 1 - Frequency: 2-3 times per week 1 - Duration: for months 1 - Last Use / Amount: "can't remember" Name of Substance 2: xanax 2 - Age of First Use: this year 2 - Amount (size/oz): 2mg 73m Frequency: daily 2 - Duration: months 2 - Last Use / Amount: yesterday Name  of Substance 3: Oxycodone 3 - Age of First Use: this year 3 - Amount (size/oz): 26m  3 - Frequency: daily 3 - Duration: for months 3 - Last Use / Amount: yesterday  Sleep: improved   Appetite:  improved   Current Medications: Current Facility-Administered Medications  Medication Dose Route Frequency Provider Last Rate Last Dose  . acetaminophen (TYLENOL) tablet 650 mg  650 mg Oral Q6H PRN JRozetta Nunnery NP   650 mg at 05/23/16 0823  . alum & mag hydroxide-simeth (MAALOX/MYLANTA) 200-200-20 MG/5ML suspension 30 mL  30 mL Oral Q4H PRN JRozetta Nunnery NP      .  citalopram (CELEXA) tablet 10 mg  10 mg Oral Daily FJenne Campus MD   10 mg at 05/23/16 06770 . cloNIDine (CATAPRES) tablet 0.1 mg  0.1 mg Oral QID JRozetta Nunnery NP   0.1 mg at 05/23/16 0835   Followed by  . [START ON 05/24/2016] cloNIDine (CATAPRES) tablet 0.1 mg  0.1 mg Oral BH-qamhs JRozetta Nunnery NP       Followed by  . [START ON 05/27/2016] cloNIDine (CATAPRES) tablet 0.1 mg  0.1 mg Oral QAC breakfast JRozetta Nunnery NP      . dicyclomine (BENTYL) tablet 20 mg  20 mg Oral Q6H PRN JRozetta Nunnery NP      . hydrOXYzine (ATARAX/VISTARIL) tablet 25 mg  25 mg Oral Q6H PRN FJenne Campus MD   25 mg at 05/23/16 0825  . [START ON 05/24/2016] Influenza vac split quadrivalent PF (FLUARIX) injection 0.5 mL  0.5 mL Intramuscular Tomorrow-1000 Fernando A Cobos, MD      . loperamide (IMODIUM) capsule 2-4 mg  2-4 mg Oral PRN FJenne Campus MD      . LORazepam (ATIVAN) tablet 1 mg  1 mg Oral Q6H PRN FMyer PeerCobos, MD      . magnesium hydroxide (MILK OF MAGNESIA) suspension 30 mL  30 mL Oral Daily PRN JRozetta Nunnery NP      . methocarbamol (ROBAXIN) tablet 500 mg  500 mg Oral Q8H PRN JRozetta Nunnery NP      . multivitamin with minerals tablet 1 tablet  1 tablet Oral Daily FJenne Campus MD   1 tablet at 05/23/16 03403 . naproxen (NAPROSYN) tablet 500 mg  500 mg Oral BID PRN JRozetta Nunnery NP   500 mg at 05/23/16 1345  . nicotine (NICODERM CQ - dosed in mg/24 hours) patch 21 mg  21 mg Transdermal Daily FJenne Campus MD   21 mg at 05/23/16 0820  . ondansetron (ZOFRAN-ODT) disintegrating tablet 4 mg  4 mg Oral Q6H PRN FJenne Campus MD      . thiamine (B-1) injection 100 mg  100 mg Intramuscular Once FMyer PeerCobos, MD      . thiamine (VITAMIN B-1) tablet 100 mg  100 mg Oral Daily FJenne Campus MD   100 mg at 05/23/16 05248 . traZODone (DESYREL) tablet 50 mg  50 mg Oral QHS PRN FJenne Campus MD        Lab Results: No results found for this or any previous visit (from the past 48  hour(s)).  Blood Alcohol level:  Lab Results  Component Value Date   [facility] 118/59/0931   Metabolic Disorder Labs: No results found for: HGBA1C, MPG No results found for: PROLACTIN No results found for: CHOL, TRIG, HDL, CHOLHDL, VLDL, LDLCALC  Physical Findings: AIMS:  Facial and Oral Movements Muscles of Facial Expression: None, normal Lips and Perioral Area: None, normal Jaw: None, normal Tongue: None, normal,Extremity Movements Upper (arms, wrists, hands, fingers): None, normal Lower (legs, knees, ankles, toes): None, normal, Trunk Movements Neck, shoulders, hips: None, normal, Overall Severity Severity of abnormal movements (highest score from questions above): None, normal Incapacitation due to abnormal movements: None, normal Patient's awareness of abnormal movements (rate only patient's report): No Awareness, Dental Status Current problems with teeth and/or dentures?: Yes Does patient usually wear dentures?: No  CIWA:  CIWA-Ar Total: 1 COWS:  COWS Total Score: 1  Musculoskeletal: Strength & Muscle Tone: within normal limits Gait & Station: normal Patient leans: N/A  Psychiatric Specialty Exam: Physical Exam  ROS denies chest pain, no shortness of breath, no vomiting   Blood pressure 90/66, pulse (!) 58, temperature 97.9 F (36.6 C), resp. rate 16, height 5' 4" (1.626 m), weight 68.3 kg (150 lb 8 oz), last menstrual period 05/20/2016, SpO2 98 %.Body mass index is 25.83 kg/m.  General Appearance: Fairly Groomed  Eye Contact:  fair, but improved compared to admission  Speech:  Normal Rate  Volume:  Normal  Mood:  improving mood , states she feels better  Affect:  appropriate, more reactive  Thought Process:  Linear  Orientation:  Full (Time, Place, and Person)  Thought Content:  denies hallucinations, no delusions, not internally preoccupied   Suicidal Thoughts:  No- at this time denies any suicidal or self injurious ideations, denies any homicidal ideations   Homicidal Thoughts:  No  Memory:  recent and remote grossly intact   Judgement:  Other:  improving   Insight:  Fair  Psychomotor Activity:  Normal  Concentration:  Concentration: Good and Attention Span: Good  Recall:  Good  Fund of Knowledge:  Good  Language:  Good  Akathisia:  Negative  Handed:  Right  AIMS (if indicated):     Assets:  Communication Skills Desire for Improvement Resilience  ADL's:  Intact  Cognition:  WNL  Sleep:  Number of Hours: 6.5   Assessment - patient is reporting feeling better and currently denies any suicidal ideations or self injurious ideations. She presents with reactive affect, and behavior is calm and in good control. Patient endorses recent cocaine, opiate,benzodiazepine abuse. At this time she is not presenting with significant withdrawal symptoms, and does not appear to be in any acute distress or discomfort, vitals stable.   Treatment Plan Summary: Daily contact with patient to assess and evaluate symptoms and progress in treatment, Medication management, Plan inpatient admission  and medications as below Continue to encourage group and milieu participation to work on coping skills and symptom reduction  Continue to encourage motivation and  efforts to work on early recovery and relapse prevention  Continue Celexa 10 mgrs QDAY for depression and anxiety  Continue Trazodone 50 mgrs QHS PRN for insomnia Continue Clonidine detox taper to minimize risk of opiate withdrawal symptoms Continue Ativan 1 mgr Q 6 hours PRN for potential benzodiazepine withdrawal symptoms Labs were done at [facility] ED - have reviewed- will order Urine Pregnancy Test  ( routine)  Neita Garnet, MD 05/23/2016, 2:26 PM

## 2016-05-23 NOTE — Progress Notes (Signed)
Patient attended the evening group session and responded to all discussion prompts from the writer. Patient shared her goal today was to be a better person. Patient feels like she has met her goal and is ready to go home. Patients affect was appropriate and rated her day a 10 out of 10.

## 2016-05-23 NOTE — [facility] Group Notes (Signed)
Adult Therapy Group Note  Date:  05/23/2016 Time: 10:00-11:00AM  Group Topic/Focus:  In group today we briefly discussed things that patients have enjoyed about the holidays.  We then listened to music that patients requested, and they shared what each song meant to them.   Participation Level:  Active  Participation Quality:  Attentive and Sharing  Affect:  Appropriate  Cognitive:  Appropriate  Insight: Good  Engagement in Group:  Developing/Improving  Modes of Intervention:  Activity and Exploration  Additional Comments:  Pt said she likes to watch her 23yo and 15yo children open gifts.  Carloyn JaegerMareida J Grossman-Orr 05/23/2016, 1:45 PM

## 2016-05-23 NOTE — Progress Notes (Addendum)
D:  Patient's self inventory sheet, patient sleeps good, sleep medication is helpful.  Good appetite, normal energy level, good concentration.  Denied depression and hopeless, anxiety 5.  Withdrawals, runny nose.  Denied SI.  Headaches, worst pain #8 in past 24 hours, back, no pain medication.  Goal is to discharge.  Does have discharge plans. A:  Medications administered per MD orders.  Emotional support and encouragement given patient. R:  Denied SI and HI, contracts for safety.  Denied A/V hallucinations.  Safety maintained with 15 minute checks.  UA obtained and put in lab for pick up.

## 2016-05-23 NOTE — [facility] Group Notes (Signed)
Nurse Psycho-Educational Group centered on human needs and healthy support systems.  Patient attended the group and engaged actively.  

## 2016-05-23 NOTE — Plan of Care (Signed)
Problem: Education: Goal: Utilization of techniques to improve thought processes will improve Outcome: Progressing Nurse discussed depression/anxiety with patient.    

## 2016-05-24 DIAGNOSIS — F17210 Nicotine dependence, cigarettes, uncomplicated: Secondary | ICD-10-CM

## 2016-05-24 DIAGNOSIS — Z9049 Acquired absence of other specified parts of digestive tract: Secondary | ICD-10-CM

## 2016-05-24 DIAGNOSIS — F332 Major depressive disorder, recurrent severe without psychotic features: Secondary | ICD-10-CM

## 2016-05-24 DIAGNOSIS — F1190 Opioid use, unspecified, uncomplicated: Secondary | ICD-10-CM

## 2016-05-24 DIAGNOSIS — Z9889 Other specified postprocedural states: Secondary | ICD-10-CM

## 2016-05-24 DIAGNOSIS — F1390 Sedative, hypnotic, or anxiolytic use, unspecified, uncomplicated: Secondary | ICD-10-CM

## 2016-05-24 LAB — PREGNANCY, URINE: PREG TEST UR: NEGATIVE

## 2016-05-24 NOTE — Plan of Care (Signed)
Problem: Safety: Goal: Periods of time without injury will increase Outcome: Progressing Pt. denies SI/HI/AVH at this time, remians a high fall risk (hx of seizures), Q 15 checks in place for safety.

## 2016-05-24 NOTE — Progress Notes (Signed)
Adult Psychoeducational Group Note  Date:  05/24/2016 Time:  8:32 PM  Group Topic/Focus:  Wrap-Up Group:   The focus of this group is to help patients review their daily goal of treatment and discuss progress on daily workbooks.   Participation Level:  Active  Participation Quality:  Appropriate  Affect:  Appropriate  Cognitive:  Appropriate  Insight: Appropriate  Engagement in Group:  Engaged  Modes of Intervention:  Discussion  Additional Comments:  Pt states that she wasn't happy when the doctor told her that she would not be able to leave today but she states her mood got better when her children came to visit her. Martha Chen 05/24/2016, 8:32 PM

## 2016-05-24 NOTE — Progress Notes (Signed)
Martha Chen had been up and visible in milieu this evening, did attend evening group activity. She spoke about how she was feeling better, ready to go home but did endorse on-going pain and anxiety and did ask for and receive medications to help. A. Support and encouragement provided. R. Safety maintained, will continue to monitor.

## 2016-05-24 NOTE — Plan of Care (Signed)
Problem: Education: Goal: Emotional status will improve Outcome: Progressing Nurse discussed depression/coping skills with patient.    

## 2016-05-24 NOTE — Progress Notes (Signed)
  DATA ACTION RESPONSE  Objective- Pt. is up and visible in the milieu, seen interacting with peers approprietly. Pt. presents with an animated but anxious affect and mood. Subjective- Denies having any SI/HI/AVH at this time. Rates pain 8/10, mid back. Pt. states " Do you know when I am going home?" Pt. continues to be cooperative and remain pleasant on the unit.  1:1 interaction in private to establish rapport. Encouragement, education, & support given from staff. Meds. ordered and administered. PRNs Robaxin and Naprosyn requested and will re-eval accordingly.   Safety maintained with Q 15 checks. Continues to follow treatment plan and will monitor closely. No additonal questions/concerns noted.

## 2016-05-24 NOTE — Progress Notes (Signed)
D:  Patient's self inventory sheet, patient sleeps good, no sleep medication given.  Good appetite, normal energy level, good concentration.  Denied depression and hopeless.  Anxiety #5.  Denied withdrawals.  Runny nose.  Denied SI.  Physical back #7 pain.  Goal is to discharge.  Plans to be good.  Does have discharge plans. A:  Medications administered per MD orders.  Emotional support and encouragement given patient. R:  Denied SI and HI, contracts for safety.  Denied A/V hallucinations.  Safety maintained with 15 minute checks. t

## 2016-05-24 NOTE — Progress Notes (Signed)
[facility] MD Progress Note  05/24/2016 12:54 PM Martha Chen  MRN:  [id number]  Subjective: Martha Chen reports, "I'm ready to be discharge. I have not had any SIHI, AVH all day today".  Objective : I have discussed case with treatment team and have met with patient . Patient presents improved compared to admission- presents with partially improved mood and range of affect, although still somewhat constricted . She has been visible on unit, going to groups, no disruptive or agitated behaviors . She is tolerating medications well , denies side effects. Tolerating Celexa trial well. No distal tremors, no diaphoresis, no psychomotor agitation or restlessness. Vitals stable. As she improves she is focusing on disposition and on being discharged soon in order to return to her family for the holidays.  Principal Problem: Overdose Diagnosis:   Patient Active Problem List   Diagnosis Date Noted  . Overdose [T50.901A] 05/22/2016  . Major depression, recurrent ([place]) [F33.9] 05/21/2016  . Seizures ([place]) [R56.9]   . Headache [R51] 04/29/2014  . Seizure ([place]) [R56.9] 04/17/2014  . Smoking [F17.200] 04/17/2014  . Nausea and vomiting [R11.2] 04/17/2014  . Metabolic acidosis [Y69.4] 04/17/2014  . Leukocytosis [D72.829] 04/17/2014  . Hypokalemia [E87.6] 04/17/2014   Total Time spent with patient: 20 minutes  Past Medical History:  Past Medical History:  Diagnosis Date  . Headache 04/29/2014  . Seizures ([place])     Past Surgical History:  Procedure Laterality Date  . BACK SURGERY    . CHOLECYSTECTOMY    . TUBAL LIGATION     Family History:  Family History  Problem Relation Age of Onset  . Liver disease Mother   . Sarcoidosis Mother   . Emphysema Father   . Seizures Neg Hx     Social History:  History  Alcohol Use No     History  Drug Use  . Types: Oxycodone, Benzodiazepines    Comment: oxy 62m daily and xanax 261mdaily    Social History   Social History  . Marital status: Married     Spouse name: N/A  . Number of children: 2  . Years of education: 7th grade   Social History Main Topics  . Smoking status: Current Every Day Smoker    Packs/day: 1.00    Types: Cigarettes  . Smokeless tobacco: Never Used  . Alcohol use No  . Drug use:     Types: Oxycodone, Benzodiazepines     Comment: oxy 6057maily and xanax 2mg22mily  . Sexual activity: Not Asked   Other Topics Concern  . None   Social History Narrative   Patient is separated    Patient is right handed.   Patient drinks 3-4 sodas daily.      Additional Social History:    Pain Medications: see MAR Prescriptions: see MAR Over the Counter: see MAR History of alcohol / drug use?: Yes Longest period of sobriety (when/how long): just started using earlier this year Negative Consequences of Use: Financial, Legal, Personal relationships Withdrawal Symptoms: Other (Comment) ("I don't know") Name of Substance 1: cocaine 1 - Age of First Use: this year 1 - Amount (size/oz): unsure 1 - Frequency: 2-3 times per week 1 - Duration: for months 1 - Last Use / Amount: "can't remember" Name of Substance 2: xanax 2 - Age of First Use: this year 2 - Amount (size/oz): 2mg 58m Frequency: daily 2 - Duration: months 2 - Last Use / Amount: yesterday Name of Substance 3: Oxycodone 3 - Age of First  Use: this year 3 - Amount (size/oz): 61m  3 - Frequency: daily 3 - Duration: for months 3 - Last Use / Amount: yesterday  Sleep: improved   Appetite:  improved   Current Medications: Current Facility-Administered Medications  Medication Dose Route Frequency Provider Last Rate Last Dose  . acetaminophen (TYLENOL) tablet 650 mg  650 mg Oral Q6H PRN JRozetta Nunnery NP   650 mg at 05/24/16 0755  . alum & mag hydroxide-simeth (MAALOX/MYLANTA) 200-200-20 MG/5ML suspension 30 mL  30 mL Oral Q4H PRN JRozetta Nunnery NP      . citalopram (CELEXA) tablet 10 mg  10 mg Oral Daily FJenne Campus MD   10 mg at 05/24/16 0750  .  cloNIDine (CATAPRES) tablet 0.1 mg  0.1 mg Oral QID JRozetta Nunnery NP   0.1 mg at 05/24/16 1143   Followed by  . cloNIDine (CATAPRES) tablet 0.1 mg  0.1 mg Oral BH-qamhs JRozetta Nunnery NP       Followed by  . [START ON 05/27/2016] cloNIDine (CATAPRES) tablet 0.1 mg  0.1 mg Oral QAC breakfast JRozetta Nunnery NP      . dicyclomine (BENTYL) tablet 20 mg  20 mg Oral Q6H PRN JRozetta Nunnery NP      . hydrOXYzine (ATARAX/VISTARIL) tablet 25 mg  25 mg Oral Q6H PRN FJenne Campus MD   25 mg at 05/24/16 0756  . loperamide (IMODIUM) capsule 2-4 mg  2-4 mg Oral PRN FJenne Campus MD      . LORazepam (ATIVAN) tablet 1 mg  1 mg Oral Q6H PRN FMyer PeerCobos, MD      . magnesium hydroxide (MILK OF MAGNESIA) suspension 30 mL  30 mL Oral Daily PRN JRozetta Nunnery NP      . methocarbamol (ROBAXIN) tablet 500 mg  500 mg Oral Q8H PRN JRozetta Nunnery NP   500 mg at 05/24/16 1146  . multivitamin with minerals tablet 1 tablet  1 tablet Oral Daily FJenne Campus MD   1 tablet at 05/24/16 0751  . naproxen (NAPROSYN) tablet 500 mg  500 mg Oral BID PRN JRozetta Nunnery NP   500 mg at 05/24/16 1146  . nicotine (NICODERM CQ - dosed in mg/24 hours) patch 21 mg  21 mg Transdermal Daily FJenne Campus MD   21 mg at 05/24/16 0751  . ondansetron (ZOFRAN-ODT) disintegrating tablet 4 mg  4 mg Oral Q6H PRN FJenne Campus MD      . thiamine (B-1) injection 100 mg  100 mg Intramuscular Once FMyer PeerCobos, MD      . thiamine (VITAMIN B-1) tablet 100 mg  100 mg Oral Daily FJenne Campus MD   100 mg at 05/24/16 0753  . traZODone (DESYREL) tablet 50 mg  50 mg Oral QHS PRN FJenne Campus MD       Lab Results:  Results for orders placed or performed during the hospital encounter of 05/21/16 (from the past 48 hour(s))  Pregnancy, urine     Status: None   Collection Time: 05/23/16  2:45 PM  Result Value Ref Range   Preg Test, Ur NEGATIVE NEGATIVE    Comment:        THE SENSITIVITY OF THIS METHODOLOGY IS >20  mIU/mL. [facility]   Blood Alcohol level:  Lab Results  Component Value Date   [facility] 137/62/8315  Metabolic Disorder Labs: No results found  for: HGBA1C, MPG No results found for: PROLACTIN No results found for: CHOL, TRIG, HDL, CHOLHDL, VLDL, LDLCALC  Physical Findings: AIMS: Facial and Oral Movements Muscles of Facial Expression: None, normal Lips and Perioral Area: None, normal Jaw: None, normal Tongue: None, normal,Extremity Movements Upper (arms, wrists, hands, fingers): None, normal Lower (legs, knees, ankles, toes): None, normal, Trunk Movements Neck, shoulders, hips: None, normal, Overall Severity Severity of abnormal movements (highest score from questions above): None, normal Incapacitation due to abnormal movements: None, normal Patient's awareness of abnormal movements (rate only patient's report): No Awareness, Dental Status Current problems with teeth and/or dentures?: Yes Does patient usually wear dentures?: No  CIWA:  CIWA-Ar Total: 1 COWS:  COWS Total Score: 1  Musculoskeletal: Strength & Muscle Tone: within normal limits Gait & Station: normal Patient leans: N/A  Psychiatric Specialty Exam: Physical Exam  ROS denies chest pain, no shortness of breath, no vomiting   Blood pressure 115/64, pulse (!) 55, temperature 97.5 F (36.4 C), temperature source Oral, resp. rate 16, height 5' 4"  (1.626 m), weight 68.3 kg (150 lb 8 oz), last menstrual period 05/20/2016, SpO2 98 %.Body mass index is 25.83 kg/m.  General Appearance: Fairly Groomed  Chen Contact:  fair, but improved compared to admission  Speech:  Normal Rate  Volume:  Normal  Mood:  improving mood , states she feels better  Affect:  appropriate, more reactive  Thought Process:  Linear  Orientation:  Full (Time, Place, and Person)  Thought Content:  denies hallucinations, no delusions, not internally preoccupied   Suicidal Thoughts:  No- at this time denies any  suicidal or self injurious ideations, denies any homicidal ideations  Homicidal Thoughts:  No  Memory:  recent and remote grossly intact   Judgement:  Other:  improving   Insight:  Fair  Psychomotor Activity:  Normal  Concentration:  Concentration: Good and Attention Span: Good  Recall:  Good  Fund of Knowledge:  Good  Language:  Good  Akathisia:  Negative  Handed:  Right  AIMS (if indicated):     Assets:  Communication Skills Desire for Improvement Resilience  ADL's:  Intact  Cognition:  WNL  Sleep:  Number of Hours: 6.75   Assessment - patient is reporting feeling better and currently denies any suicidal ideations or self injurious ideations. She presents with reactive affect, and behavior is calm and in good control. Patient endorses recent cocaine, opiate,benzodiazepine abuse. At this time she is not presenting with significant withdrawal symptoms, and does not appear to be in any acute distress or discomfort, vitals stable.  Treatment Plan Summary: Daily contact with patient to assess and evaluate symptoms and progress in treatment, Medication management, Plan inpatient admission  and medications as below Continue to encourage group and milieu participation to work on coping skills and symptom reduction  Continue to encourage motivation and  efforts to work on early recovery and relapse prevention  Continue Celexa 10 mgrs QDAY for depression and anxiety  Continue Trazodone 50 mgrs QHS PRN for insomnia Continue Clonidine detox taper to minimize risk of opiate withdrawal symptoms Continue Ativan 1 mgr Q 6 hours PRN for potential benzodiazepine withdrawal symptoms Labs were done at [facility] ED - have reviewed- will order Urine Pregnancy Test (routine), reviewed result, negative.  Encarnacion Slates, NP, PMHNP, FNP-BC 05/24/2016, 12:54 PMPatient ID: Martha Chen, female   DOB: 1975-06-04, 40 y.o.   MRN: [id number] Agree with NP Progress Note as above

## 2016-05-25 DIAGNOSIS — Z79899 Other long term (current) drug therapy: Secondary | ICD-10-CM

## 2016-05-25 DIAGNOSIS — T1491XA Suicide attempt, initial encounter: Secondary | ICD-10-CM

## 2016-05-25 DIAGNOSIS — Z8489 Family history of other specified conditions: Secondary | ICD-10-CM

## 2016-05-25 DIAGNOSIS — T426X2A Poisoning by other antiepileptic and sedative-hypnotic drugs, intentional self-harm, initial encounter: Secondary | ICD-10-CM

## 2016-05-25 MED ORDER — CITALOPRAM HYDROBROMIDE 10 MG PO TABS: 10.0000 mg | ORAL_TABLET | Freq: Every day | ORAL | 0 refills | Status: DC

## 2016-05-25 MED ORDER — NICOTINE 21 MG/24HR TD PT24: 21.0000 mg | MEDICATED_PATCH | Freq: Every day | TRANSDERMAL | 0 refills | Status: DC

## 2016-05-25 MED ORDER — TRAZODONE HCL 50 MG PO TABS: 50.0000 mg | ORAL_TABLET | Freq: Every evening | ORAL | 0 refills | Status: DC | PRN

## 2016-05-25 MED ORDER — THIAMINE HCL 100 MG PO TABS: 100.0000 mg | ORAL_TABLET | Freq: Every day | ORAL | 0 refills | Status: DC

## 2016-05-25 MED ORDER — HYDROXYZINE HCL 25 MG PO TABS: 25.0000 mg | ORAL_TABLET | Freq: Four times a day (QID) | ORAL | 0 refills | Status: DC | PRN

## 2016-05-25 NOTE — Progress Notes (Addendum)
D: Pt A & O X4. Denies SI, HI, AVH and pain at present. Pt d/c home as ordered. Ambulatory with a steady gait. Pt rates her depression 0/10, hopelessness 0/10 and anxiety 4/10 Pt was picked up by family in main lobby.  A: Scheduled medications administered as prescribed. D/C instructions reviewed with pt including prescriptions and follow up appointment. All belongings in locker 16 returned to pt. Emotional support and availability provided to pt. Encouraged pt to comply with d/c instructions. Q 15 minutes safety checks maintained till time of d/c without self harm gestures or outburst.  R: Pt receptive to care. Compliant with medications when offered. Denies adverse drug reactions when assessed. Pt verbalized understanding related to d/c instructions. Signed belonging sheet in agreement with items received from locker. Appears to be in no physical distress at time of departure.

## 2016-05-25 NOTE — [facility] Suicide Risk Assessment (Signed)
[facility] INPATIENT:  Family/Significant Other Suicide Prevention Education  Suicide Prevention Education:  Education Completed; Martha CampionRichard Chen, Pt's significant other [phone], has been identified by the patient as the family member/significant other with whom the patient will be residing, and identified as the person(s) who will aid the patient in the event of a mental health crisis (suicidal ideations/suicide attempt).  With written consent from the patient, the family member/significant other has been provided the following suicide prevention education, prior to the and/or following the discharge of the patient.  The suicide prevention education provided includes the following:  Suicide risk factors  Suicide prevention and interventions  National Suicide Hotline telephone number  [facility] assessment telephone number  [facility] Emergency Assistance 911  [facility] and/or Residential Mobile Crisis Unit telephone number  Request made of family/significant other to:  Remove weapons (e.g., guns, rifles, knives), all items previously/currently identified as safety concern.    Remove drugs/medications (over-the-counter, prescriptions, illicit drugs), all items previously/currently identified as a safety concern.  The family member/significant other verbalizes understanding of the suicide prevention education information provided.  The family member/significant other agrees to remove the items of safety concern listed above.  Martha LennertLauren C Raza Chen 05/25/2016, 11:58 AM

## 2016-05-25 NOTE — [facility] Suicide Risk Assessment (Signed)
[facility]   Principal Problem: Overdose Discharge Diagnoses:  Patient Active Problem List   Diagnosis Date Noted  . Overdose [T50.901A] 05/22/2016  . Major depression, recurrent (HCC) [F33.9] 05/21/2016  . Seizures (HCC) [R56.9]   . Headache [R51] 04/29/2014  . Seizure (HCC) [R56.9] 04/17/2014  . Smoking [F17.200] 04/17/2014  . Nausea and vomiting [R11.2] 04/17/2014  . Metabolic acidosis [E87.2] 04/17/2014  . Leukocytosis [D72.829] 04/17/2014  . Hypokalemia [E87.6] 04/17/2014    Total Time spent with patient: 30 minutes  Musculoskeletal: Strength & Muscle Tone: within normal limits Gait & Station: normal Patient leans: N/A  Psychiatric Specialty Exam: ROS denies headache, no chest pain, no shortness of breath, no nausea, no vomiting   Blood pressure (!) 93/52, pulse 70, temperature 98.3 F (36.8 C), resp. rate (!) 21, height 5\' 4"  (1.626 m), weight 68.3 kg (150 lb 8 oz), last menstrual period 05/20/2016, SpO2 98 %.Body mass index is 25.83 kg/m.  General Appearance: Well Groomed  Eye Contact::  Good  Speech:  [phone]  Volume:  Normal  Mood:  describes improved mood, states she feels better  Affect:  Appropriate and Full Range  Thought Process:  Linear  Orientation:  Full (Time, Place, and Person)  Thought Content:  no hallucinations, no delusions  Suicidal Thoughts:  No denies any suicidal or self injurious ideations, denies any homicidal or violent ideations  Homicidal Thoughts:  No  Memory:  recent and remote grossly intact   Judgement:  Other:  improving   Insight:  improving   Psychomotor Activity:  Normal  Concentration:  Good  Recall:  Good  Fund of Knowledge:Negative  Language: Good  Akathisia:  Negative  Handed:  Right  AIMS (if indicated):     Assets:  Desire for Improvement Resilience  Sleep:  Number of Hours: 6.75  Cognition: WNL  ADL's:  Intact   Mental Status Per Nursing Assessment::   On Admission:   NA  Demographic Factors:  40 year old female   Loss Factors: Loss of loved one, legal issues, relationship stressors   Historical Factors: History of depression, history of substance abuse   Risk Reduction Factors:   Responsible for children under 39 years of age, Sense of responsibility to family, Living with another person, especially a relative, Positive social support and Positive coping skills or problem solving skills  Continued Clinical Symptoms:  At this time patient is alert, attentive, well related, pleasant, calm, mood is improved , affect is appropriate, reactive, no thought disorder, no suicidal or self injurious ideations, no homicidal or violent ideations, no hallucinations, no delusions,  future oriented. Denies medication side effects  Cognitive Features That Contribute To Risk:  No gross cognitive deficits noted upon discharge. Is alert , attentive, and oriented x 3   Suicide Risk:  Mild:  Suicidal ideation of limited frequency, intensity, duration, and specificity.  There are no identifiable plans, no associated intent, mild dysphoria and related symptoms, good self-control (both objective and subjective assessment), few other risk factors, and identifiable protective factors, including available and accessible social support.  Follow-up Information    [facility] Follow up.   Why:  An appointment cannot be made over the weekend or on Christmas Day because the agency is closed.  Your social worker will make an appointment for follow-up and will contact you with the information. Contact information: [address] [place], [place] [address] Phone: [phone]          Plan Of Care/Follow-up  recommendations:  Activity:  as tolerated  Diet:  Regular  Tests:  NA Other:  see below   Patient is requesting discharge and there are no current grounds for involuntary commitment She is discharging in good spirits  Plans to return home Plans to follow  up as above   Nehemiah MassedOBOS, Annaclaire Walsworth, MD 05/25/2016, 11:35 AM

## 2016-05-25 NOTE — Discharge Summary (Signed)
Physician Discharge Summary Note  Patient:  Martha Chen is an 40 y.o., female MRN:  [id number] DOB:  22-Sep-1975 Patient phone:  [phone] (home)  Patient address:   [address] [place] [address],  Total Time spent with patient: 45 minutes  Date of Admission:  05/21/2016 Date of Discharge: 05/25/16  Reason for Admission:   Martha Chen an 40 y.o.femaleoverdosed on 10 Gabapentin, Wednesday. Was unable to contract for safety. Stressors include the loss of a loved one, currently arguing with boyfriend, and a recent drug possession charge. Patietn has a court date on 06/25/15. She is also on probahaiton for allowingher 38 ry old daughter to miss too many days of school.  Denies HI. Occasionally has auditory and visual hallucination but not currently. 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 Previos inpatient several years ago and an overdose attempt. No outpatient treatment in several years, as well. Patient was dressed in scrubs, alert, oriented, good eye contact, depressed mood and affect, cooperative,had impaired judgement, hopelessness, worthlessness,and wastearful. Patient was positive for cocaine, benzo and opiate  On Evaluation:Martha Chen is awake, alert and oriented *3. seen attending group session.  Denies suicidal or homicidal ideation. Denies auditory or visual hallucination and does not appear to be responding to internal stimuli.patient vailated the information that was provided in the HPI. Patient reports she is has chronic back pain which is why she self medicates. Reports she is going to try and get it together for her grand kids. Support, encouragement and reassurance was provided.   Principal Problem: Overdose Discharge Diagnoses: Patient Active Problem List   Diagnosis Date Noted  . Overdose [T50.901A] 05/22/2016  . Major depression, recurrent (HCC) [F33.9] 05/21/2016  . Seizures (HCC) [R56.9]   . Headache [R51] 04/29/2014  .  Seizure (HCC) [R56.9] 04/17/2014  . Smoking [F17.200] 04/17/2014  . Nausea and vomiting [R11.2] 04/17/2014  . Metabolic acidosis [E87.2] 04/17/2014  . Leukocytosis [D72.829] 04/17/2014  . Hypokalemia [E87.6] 04/17/2014    Past Psychiatric History: see H&P  Past Medical History:  Past Medical History:  Diagnosis Date  . Headache 04/29/2014  . Seizures (HCC)     Past Surgical History:  Procedure Laterality Date  . BACK SURGERY    . CHOLECYSTECTOMY    . TUBAL LIGATION     Family History:  Family History  Problem Relation Age of Onset  . Liver disease Mother   . Sarcoidosis Mother   . Emphysema Father   . Seizures Neg Hx    Family Psychiatric  History: see H&P Social History:  History  Alcohol Use No     History  Drug Use  . Types: Oxycodone, Benzodiazepines    Comment: oxy 60mg  daily and xanax 2mg  daily    Social History   Social History  . Marital status: Married    Spouse name: N/A  . Number of children: 2  . Years of education: 7th grade   Social History Main Topics  . Smoking status: Current Every Day Smoker    Packs/day: 1.00    Types: Cigarettes  . Smokeless tobacco: Never Used  . Alcohol use No  . Drug use:     Types: Oxycodone, Benzodiazepines     Comment: oxy 60mg  daily and xanax 2mg  daily  . Sexual activity: Not Asked   Other Topics Concern  . None   Social History Narrative   Patient is separated    Patient is right handed.   Patient drinks 3-4 sodas daily.  Hospital Course:   Martha SchleinCrystal Chen was admitted for Overdose , with crisis management.  Pt was treated discharged with the medications listed below under Medication List.  Medical problems were identified and treated as needed.  Home medications were restarted as appropriate.  Improvement was monitored by observation and Martha Chen 's daily report of symptom reduction.  Emotional and mental status was monitored by daily self-inventory reports completed by Martha Chen and  clinical staff.         Martha Chen was evaluated by the treatment team for stability and plans for continued recovery upon discharge. Martha Chen 's motivation was an integral factor for scheduling further treatment. Employment, transportation, bed availability, health status, family support, and any pending legal issues were also considered during hospital stay. Pt was offered further treatment options upon discharge including but not limited to Residential, Intensive Outpatient, and Outpatient treatment.  Martha Chen will follow up with the services as listed below under Follow Up Information.     Upon completion of this admission the patient was both mentally and medically stable for discharge denying suicidal/homicidal ideation, auditory/visual/tactile hallucinations, delusional thoughts and paranoia.    Martha Chen responded well to treatment with celexa, vistaril, nicotine, thiamine, trazodone without adverse effects. Pt demonstrated improvement without reported or observed adverse effects to the point of stability appropriate for outpatient management. Pertinent labs include: potassium 3.4. Reviewed CBC, CMP, BAL, and UDS; all unremarkable aside from noted exceptions.    Physical Findings: AIMS: Facial and Oral Movements Muscles of Facial Expression: None, normal Lips and Perioral Area: None, normal Jaw: None, normal Tongue: None, normal,Extremity Movements Upper (arms, wrists, hands, fingers): None, normal Lower (legs, knees, ankles, toes): None, normal, Trunk Movements Neck, shoulders, hips: None, normal, Overall Severity Severity of abnormal movements (highest score from questions above): None, normal Incapacitation due to abnormal movements: None, normal Patient's awareness of abnormal movements (rate only patient's report): No Awareness, Dental Status Current problems with teeth and/or dentures?: Yes Does patient usually wear dentures?: No  CIWA:  CIWA-Ar Total: 3 COWS:   COWS Total Score: 4  Musculoskeletal: Strength & Muscle Tone: within normal limits Gait & Station: normal Patient leans: N/A  Psychiatric Specialty Exam: Physical Exam  Review of Systems  Psychiatric/Behavioral: Positive for depression. Negative for hallucinations, substance abuse and suicidal ideas. The patient is nervous/anxious and has insomnia.   All other systems reviewed and are negative.   Blood pressure (!) 93/52, pulse 70, temperature 98.3 F (36.8 C), resp. rate (!) 21, height 5\' 4"  (1.626 m), weight 68.3 kg (150 lb 8 oz), last menstrual period 05/20/2016, SpO2 98 %.Body mass index is 25.83 kg/m.  SEE MD PSE IN SRA  Have you used any form of tobacco in the last 30 days? (Cigarettes, Smokeless Tobacco, Cigars, and/or Pipes): Yes  Has this patient used any form of tobacco in the last 30 days? (Cigarettes, Smokeless Tobacco, Cigars, and/or Pipes) Yes, Yes, A prescription for an FDA-approved tobacco cessation medication was offered at discharge and the patient refused  Blood Alcohol level:  Lab Results  Component Value Date   [facility]11 04/16/2014    Metabolic Disorder Labs:  No results found for: HGBA1C, MPG No results found for: PROLACTIN No results found for: CHOL, TRIG, HDL, CHOLHDL, VLDL, LDLCALC  See Psychiatric Specialty Exam and Suicide Risk Assessment completed by Attending Physician prior to discharge.  Discharge destination:  Home  Is patient on multiple antipsychotic therapies at discharge:  No   Has Patient had three  or more failed trials of antipsychotic monotherapy by history:  No  Recommended Plan for Multiple Antipsychotic Therapies: NA   Allergies as of 05/25/2016   No Known Allergies     Medication List    TAKE these medications     Indication  citalopram 10 MG tablet Commonly known as:  CELEXA Take 1 tablet (10 mg total) by mouth daily. Start taking on:  05/26/2016  Indication:  Depression   hydrOXYzine 25 MG tablet Commonly known as:   ATARAX/VISTARIL Take 1 tablet (25 mg total) by mouth every 6 (six) hours as needed for anxiety.  Indication:  Anxiety Neurosis   nicotine 21 mg/24hr patch Commonly known as:  NICODERM CQ - dosed in mg/24 hours Place 1 patch (21 mg total) onto the skin daily. Start taking on:  05/26/2016  Indication:  Nicotine Addiction   thiamine 100 MG tablet Take 1 tablet (100 mg total) by mouth daily. Start taking on:  05/26/2016  Indication:  Deficiency in Thiamine or Vitamin B1   traZODone 50 MG tablet Commonly known as:  DESYREL Take 1 tablet (50 mg total) by mouth at bedtime as needed for sleep.  Indication:  Trouble Sleeping      Follow-up Information    [facility] Follow up.   Why:  An appointment cannot be made over the weekend or on Christmas Day because the agency is closed.  Your social worker will make an appointment for follow-up and will contact you with the information. Contact information: [address] [place], [place] [address] Phone: [phone]          Follow-up recommendations:  Activity:  As tolerated Diet:  Heart healthy with low sodium.  Comments:   Take all medications as prescribed. Keep all follow-up appointments as scheduled.  Do not consume alcohol or use illegal drugs while on prescription medications. Report any adverse effects from your medications to your primary care provider promptly.  In the event of recurrent symptoms or worsening symptoms, call 911, a crisis hotline, or go to the nearest emergency department for evaluation.    Signed: Beau FannyWithrow, John C, FNP 05/25/2016, 10:42 AM   Patient seen, Suicide Assessment Completed.  Disposition Plan Reviewed

## 2016-05-25 NOTE — Progress Notes (Signed)
  [facility] :  Will you be returning to the same living situation after discharge:  Yes,  Pt returning home At discharge, do you have transportation home?: Yes,  Pt significant other to pick up Do you have the ability to pay for your medications: Yes,  Pt provided with prescriptions  Release of information consent forms completed and in the chart;  Patient's signature needed at discharge.  Patient to Follow up at: Follow-up Information    [facility] Follow up.   Why:  Office was closed on 12/26. CSW faxed demographics and hospital information to this office. Please call this office on 12/27 to obtain a hospital discharge appointment.  Contact information: [address] [place], [place] [address] Phone: [phone]          Next level of care provider has access to [facility] Link:no  Safety Planning and Suicide Prevention discussed: Yes,  with significant other  Have you used any form of tobacco in the last 30 days? (Cigarettes, Smokeless Tobacco, Cigars, and/or Pipes): Yes  Has patient been referred to the Quitline?: Patient refused referral  Patient has been referred for addiction treatment: Yes  Verdene LennertLauren C Antuan Limes 05/25/2016, 12:58 PM

## 2016-05-25 NOTE — Tx Team (Signed)
Interdisciplinary Treatment and Diagnostic Plan Update  05/25/2016 Time of Session: 12:54 PM  Shirlean SchleinCrystal Linker MRN: [id number]  Principal Diagnosis: Overdose  Secondary Diagnoses: Principal Problem:   Overdose Active Problems:   Major depression, recurrent (HCC)   Current Medications:  Current Facility-Administered Medications  Medication Dose Route Frequency Provider Last Rate Last Dose  . acetaminophen (TYLENOL) tablet 650 mg  650 mg Oral Q6H PRN Jackelyn PolingJason A Berry, NP   650 mg at 05/24/16 0755  . alum & mag hydroxide-simeth (MAALOX/MYLANTA) 200-200-20 MG/5ML suspension 30 mL  30 mL Oral Q4H PRN Jackelyn PolingJason A Berry, NP      . citalopram (CELEXA) tablet 10 mg  10 mg Oral Daily Craige CottaFernando A Cobos, MD   10 mg at 05/25/16 0807  . dicyclomine (BENTYL) tablet 20 mg  20 mg Oral Q6H PRN Jackelyn PolingJason A Berry, NP      . hydrOXYzine (ATARAX/VISTARIL) tablet 25 mg  25 mg Oral Q6H PRN Craige CottaFernando A Cobos, MD   25 mg at 05/24/16 1716  . loperamide (IMODIUM) capsule 2-4 mg  2-4 mg Oral PRN Craige CottaFernando A Cobos, MD      . LORazepam (ATIVAN) tablet 1 mg  1 mg Oral Q6H PRN Rockey SituFernando A Cobos, MD      . magnesium hydroxide (MILK OF MAGNESIA) suspension 30 mL  30 mL Oral Daily PRN Jackelyn PolingJason A Berry, NP      . methocarbamol (ROBAXIN) tablet 500 mg  500 mg Oral Q8H PRN Jackelyn PolingJason A Berry, NP   500 mg at 05/24/16 2110  . multivitamin with minerals tablet 1 tablet  1 tablet Oral Daily Craige CottaFernando A Cobos, MD   1 tablet at 05/25/16 [id number]  . naproxen (NAPROSYN) tablet 500 mg  500 mg Oral BID PRN Jackelyn PolingJason A Berry, NP   500 mg at 05/24/16 2110  . nicotine (NICODERM CQ - dosed in mg/24 hours) patch 21 mg  21 mg Transdermal Daily Craige CottaFernando A Cobos, MD   21 mg at 05/25/16 0808  . ondansetron (ZOFRAN-ODT) disintegrating tablet 4 mg  4 mg Oral Q6H PRN Craige CottaFernando A Cobos, MD      . thiamine (B-1) injection 100 mg  100 mg Intramuscular Once Rockey SituFernando A Cobos, MD      . thiamine (VITAMIN B-1) tablet 100 mg  100 mg Oral Daily Craige CottaFernando A Cobos, MD   100 mg at 05/25/16  0807  . traZODone (DESYREL) tablet 50 mg  50 mg Oral QHS PRN Craige CottaFernando A Cobos, MD        PTA Medications: No prescriptions prior to admission.    Treatment Modalities: Medication Management, Group therapy, Case management,  1 to 1 session with clinician, Psychoeducation, Recreational therapy.  Patient Stressors: Financial difficulties Substance abuse Traumatic event  Patient Strengths: Capable of independent living Wellsite geologistCommunication skills General fund of knowledge Motivation for treatment/growth Supportive family/friends  Physician Treatment Plan for Primary Diagnosis: Overdose Long Term Goal(s): Improvement in symptoms so as ready for discharge  Short Term Goals: Ability to identify changes in lifestyle to reduce recurrence of condition will improve Ability to verbalize feelings will improve Ability to identify and develop effective coping behaviors will improve Ability to disclose and discuss suicidal ideas Ability to demonstrate self-control will improve Compliance with prescribed medications will improve  Medication Management: Evaluate patient's response, side effects, and tolerance of medication regimen.  Therapeutic Interventions: 1 to 1 sessions, Unit Group sessions and Medication administration.  Evaluation of Outcomes: Adequate for Discharge  Physician Treatment Plan for Secondary Diagnosis: Principal Problem:  Overdose Active Problems:   Major depression, recurrent (HCC)   Long Term Goal(s): Improvement in symptoms so as ready for discharge  Short Term Goals: Ability to identify changes in lifestyle to reduce recurrence of condition will improve Ability to verbalize feelings will improve Ability to identify and develop effective coping behaviors will improve Ability to disclose and discuss suicidal ideas Ability to demonstrate self-control will improve Compliance with prescribed medications will improve  Medication Management: Evaluate patient's response,  side effects, and tolerance of medication regimen.  Therapeutic Interventions: 1 to 1 sessions, Unit Group sessions and Medication administration.  Evaluation of Outcomes: Adequate for Discharge   RN Treatment Plan for Primary Diagnosis: Overdose Long Term Goal(s): Knowledge of disease and therapeutic regimen to maintain health will improve  Short Term Goals: Ability to verbalize feelings will improve, Ability to disclose and discuss suicidal ideas and Ability to identify and develop effective coping behaviors will improve  Medication Management: RN will administer medications as ordered by provider, will assess and evaluate patient's response and provide education to patient for prescribed medication. RN will report any adverse and/or side effects to prescribing provider.  Therapeutic Interventions: 1 on 1 counseling sessions, Psychoeducation, Medication administration, Evaluate responses to treatment, Monitor vital signs and CBGs as ordered, Perform/monitor CIWA, COWS, AIMS and Fall Risk screenings as ordered, Perform wound care treatments as ordered.  Evaluation of Outcomes: Adequate for Discharge   LCSW Treatment Plan for Primary Diagnosis: Overdose Long Term Goal(s): Safe transition to appropriate next level of care at discharge, Engage patient in therapeutic group addressing interpersonal concerns.  Short Term Goals: Engage patient in aftercare planning with referrals and resources and Increase skills for wellness and recovery  Therapeutic Interventions: Assess for all discharge needs, 1 to 1 time with Social worker, Explore available resources and support systems, Assess for adequacy in community support network, Educate family and significant other(s) on suicide prevention, Complete Psychosocial Assessment, Interpersonal group therapy.  Evaluation of Outcomes: Adequate for Discharge   Progress in Treatment: Attending groups: Yes Participating in groups: Yes  Taking medication  as prescribed: Yes, MD continues to assess for medication changes as needed Toleration medication: Yes, no side effects reported at this time Family/Significant other contact made: Yes with significant other Patient understands diagnosis: Yes AEB willingness to seek treatment Discussing patient identified problems/goals with staff: Yes Medical problems stabilized or resolved: Yes Denies suicidal/homicidal ideation: Yes Issues/concerns per patient self-inventory: None Other: N/A  New problem(s) identified: None identified at this time.   New Short Term/Long Term Goal(s): None identified at this time.   Discharge Plan or Barriers: Pt will return home and follow-up with outpatient services.   Reason for Continuation of Hospitalization: None identified at this time.   Estimated Length of Stay: 0 days  Attendees: Patient: 05/25/2016  12:54 PM  Physician: Dr. Jama Flavorsobos 05/25/2016  12:54 PM  Nursing: Arnoldo MoraleBritney T, Olivette W, Shalita F 05/25/2016  12:54 PM  RN Care Manager: Onnie BoerJennifer Clark, RN 05/25/2016  12:54 PM  Social Worker: Vernie ShanksLauren Jaleeya Mcnelly, LCSW; Trula SladeHeather Smart, LCSW; [place] Hyatt, RN 05/25/2016  12:54 PM  Recreational Therapist:  05/25/2016  12:54 PM  Other: Armandina StammerAgnes Nwoko, NP 05/25/2016  12:54 PM  Other:  05/25/2016  12:54 PM  Other: 05/25/2016  12:54 PM    Scribe for Treatment Team: Verdene LennertLauren C Maaz Spiering, LCSW 05/25/2016 12:54 PM

## 2016-11-02 ENCOUNTER — Encounter (HOSPITAL_COMMUNITY): Payer: Self-pay

## 2016-11-02 ENCOUNTER — Inpatient Hospital Stay (HOSPITAL_COMMUNITY)
Admission: AD | Admit: 2016-11-02 | Discharge: 2016-11-05 | DRG: 885 | Disposition: A | Discharge status: Home / Self Care | Payer: Medicaid Other | Source: Intra-hospital | Attending: Psychiatry | Admitting: Psychiatry

## 2016-11-02 DIAGNOSIS — Z791 Long term (current) use of non-steroidal anti-inflammatories (NSAID): Secondary | ICD-10-CM

## 2016-11-02 DIAGNOSIS — T1491XA Suicide attempt, initial encounter: Secondary | ICD-10-CM | POA: Diagnosis not present

## 2016-11-02 DIAGNOSIS — T39312A Poisoning by propionic acid derivatives, intentional self-harm, initial encounter: Secondary | ICD-10-CM | POA: Diagnosis not present

## 2016-11-02 DIAGNOSIS — T426X2A Poisoning by other antiepileptic and sedative-hypnotic drugs, intentional self-harm, initial encounter: Secondary | ICD-10-CM | POA: Diagnosis not present

## 2016-11-02 DIAGNOSIS — Z79899 Other long term (current) drug therapy: Secondary | ICD-10-CM | POA: Diagnosis not present

## 2016-11-02 DIAGNOSIS — G4700 Insomnia, unspecified: Secondary | ICD-10-CM | POA: Diagnosis present

## 2016-11-02 DIAGNOSIS — F339 Major depressive disorder, recurrent, unspecified: Secondary | ICD-10-CM | POA: Diagnosis present

## 2016-11-02 DIAGNOSIS — F17210 Nicotine dependence, cigarettes, uncomplicated: Secondary | ICD-10-CM | POA: Diagnosis present

## 2016-11-02 DIAGNOSIS — F332 Major depressive disorder, recurrent severe without psychotic features: Secondary | ICD-10-CM | POA: Diagnosis not present

## 2016-11-02 DIAGNOSIS — Z818 Family history of other mental and behavioral disorders: Secondary | ICD-10-CM

## 2016-11-02 MED ORDER — NAPROXEN 500 MG PO TABS
500.0000 mg | ORAL_TABLET | Freq: Two times a day (BID) | ORAL | Status: DC | PRN
  Administered 2016-11-02 – 2016-11-04 (×3): 500 mg via ORAL
  Filled 2016-11-02 (×2): qty 1

## 2016-11-02 MED ORDER — TRAZODONE HCL 50 MG PO TABS
50.0000 mg | ORAL_TABLET | Freq: Every day | ORAL | Status: DC
  Administered 2016-11-02 – 2016-11-04 (×3): 50 mg via ORAL
  Filled 2016-11-02 (×8): qty 1

## 2016-11-02 MED ORDER — HYDROXYZINE HCL 25 MG PO TABS
25.0000 mg | ORAL_TABLET | Freq: Four times a day (QID) | ORAL | Status: DC | PRN
  Administered 2016-11-02 – 2016-11-05 (×6): 25 mg via ORAL
  Filled 2016-11-02 (×6): qty 1

## 2016-11-02 MED ORDER — ALUM & MAG HYDROXIDE-SIMETH 200-200-20 MG/5ML PO SUSP: 30.0000 mL | ORAL | Status: DC | PRN

## 2016-11-02 MED ORDER — NAPROXEN 500 MG PO TABS
500.0000 mg | ORAL_TABLET | Freq: Two times a day (BID) | ORAL | Status: DC
  Filled 2016-11-02: qty 1

## 2016-11-02 MED ORDER — NICOTINE 21 MG/24HR TD PT24
21.0000 mg | MEDICATED_PATCH | Freq: Every day | TRANSDERMAL | Status: DC
  Administered 2016-11-03 – 2016-11-05 (×3): 21 mg via TRANSDERMAL
  Filled 2016-11-02 (×5): qty 1

## 2016-11-02 MED ORDER — MAGNESIUM HYDROXIDE 400 MG/5ML PO SUSP: 30.0000 mL | Freq: Every day | ORAL | Status: DC | PRN

## 2016-11-02 MED ORDER — ACETAMINOPHEN 325 MG PO TABS
650.0000 mg | ORAL_TABLET | Freq: Four times a day (QID) | ORAL | Status: DC | PRN
Start: 2016-11-02 — End: 2016-11-05
  Administered 2016-11-02 – 2016-11-04 (×4): 650 mg via ORAL
  Filled 2016-11-02 (×4): qty 2

## 2016-11-02 NOTE — Tx Team (Signed)
Initial Treatment Plan 11/02/2016 4:19 PM Shirlean Schleinrystal Balz [id number]    PATIENT STRESSORS: Financial difficulties Medication change or noncompliance Substance abuse   PATIENT STRENGTHS: Wellsite geologistCommunication skills General fund of knowledge Motivation for treatment/growth   PATIENT IDENTIFIED PROBLEMS: Depression  Suicidal ideation  Substance abuse  "I want to be a better positive person"  "do better and get the help that I need"             DISCHARGE CRITERIA:  Improved stabilization in mood, thinking, and/or behavior Motivation to continue treatment in a less acute level of care Verbal commitment to aftercare and medication compliance Withdrawal symptoms are absent or subacute and managed without 24-hour nursing intervention  PRELIMINARY DISCHARGE PLAN: Outpatient therapy Medication management  PATIENT/FAMILY INVOLVEMENT: This treatment plan has been presented to and reviewed with the patient, Shirlean Schleinrystal Christiano.  The patient and family have been given the opportunity to ask questions and make suggestions.  Levin BaconHeather V Brionne Mertz, RN 11/02/2016, 4:19 PM

## 2016-11-02 NOTE — Progress Notes (Signed)
Patient stated she does have SI thought off/on, "always in my head".  Contracts for safety, no plan. HI to her "old man".  Denied A/V hallucinations.  Stated she got herself into another mess. Respirations even and unlabored.  No signs/symptoms of pain/distress noted on patient's face/body movements.

## 2016-11-02 NOTE — Progress Notes (Signed)
Martha Chen is a 41 year old female being admitted voluntarily [facility].  She came to the ED after OD attempt with Neurontin and Ibuprofen.  She reported her stressors are living with 10 other people and using crack cocaine.  She denied any medical issues and appears to be in no physical distress.  She denied SI at this time but is able to contract for safety on the unit.  She denies HI or A/V hallucinations.  Oriented her to the unit.  Admission paperwork completed and signed.  Belongings searched and secured in locker # 14.  Skin assessment completed and noted mid back surgery scar and healing laceration to forehead/hair line (staples recently removed) with no redness or drainage noted.  Q 15 minute checks initiated for safety.  We will monitor the progress towards her goals.

## 2016-11-02 NOTE — BH Assessment (Signed)
Assessment Note  Martha Chen is a 41 y.o. female, who presented voluntarily to [facility] on 11/01/2016. Below are the assessments completed by Barrington Ellison, TTS and Hilary Hertz provider:  Assessment from [facility]: PT PRESENTS TODAY VIA EMS FOR OVERDOSE.   STATES SHE INTENTIONALLY TOOK 30 COUNT 300 MG NEUROTIN AND ONE 800 MG IBUPROFEN WITH AN INTENTION TO KILL HERSELF.   STATES SHE IS STRESSED OVER FINANCES.   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  NO ONE IS WORKING.   PT GETS AN SSI CHECK FOR $600/MONTH AND FOOD STAMPS, OTHERWISE, NO INCOME.  STATES SHE SMOKES CRACK.  PT RECENTLY ADMITTED HERE FOR SAME 6 MONTHS AGO.  PT DENIES PHYSICAL SYMPTOMS RESULTING FROM HER OVERDOSE.   Assessment from [facility]: Pt states that she is depressed, has chronic pain, self medicates with crack and pain pills off the street, and attempted an overdose to kill herself and asked her nephew to kill her as well.  She states that "the kids called the ambulance" after her overdose. She states that she lives in a trailer with 10 people, and "I can't get anyone to pay rent of help clean up". She states that she has upcoming court dates  (see assessment) and is on probation for her daughter "not going to school, I can't do anything with her". She states that she is supposed to be taking Vistoril and Celexa, but has run out of Vistoril two days ago and has "been out" of the Celexa.   Pt was cooperative but drowsy with depressed mood and congruent affect. She was oriented x4. Pt states she has been a pt at [facility] "not long ago".  She denies AVH and HI.    Diagnosis: MDD, recurrent, severe; Substance Abuse d/o  Past Medical History:  Past Medical History:  Diagnosis Date  . Headache 04/29/2014  . Seizures (HCC)     Past Surgical History:  Procedure Laterality Date  . BACK SURGERY    . CHOLECYSTECTOMY    . TUBAL LIGATION      Family History:  Family History   Problem Relation Age of Onset  . Liver disease Mother   . Sarcoidosis Mother   . Emphysema Father   . Seizures Neg Hx     Social History:  reports that she has been smoking Cigarettes.  She has been smoking about 1.00 pack per day. She has never used smokeless tobacco. She reports that she uses drugs, including Oxycodone and Benzodiazepines. She reports that she does not drink alcohol.  Additional Social History:  Alcohol / Drug Use Pain Medications: denied Prescriptions: denied Over the Counter: denied History of alcohol / drug use?: Yes (Pt admits to using crack.)  CIWA:   COWS:    Allergies: No Known Allergies  Home Medications:  (Not in a hospital admission)  OB/GYN Status:  No LMP recorded.  General Assessment Data Location of Assessment: [facility] TTS Assessment: Out of system Is this a Tele or Face-to-Face Assessment?: Tele Assessment Is this an Initial Assessment or a Re-assessment for this encounter?: Initial Assessment Marital status: Single Is patient pregnant?: No Pregnancy Status: No Living Arrangements: Children Can pt return to current living arrangement?: Yes Admission Status: Voluntary Is patient capable of signing voluntary admission?: Yes Referral Source: Self/Family/Friend Insurance type: Medicaid     Crisis Care Plan Living Arrangements: Children Name of Psychiatrist: none Name of Therapist: none  Education Status Is patient currently  in school?: No  Risk to self with the past 6 months Suicidal Ideation: Yes-Currently Present Has patient been a risk to self within the past 6 months prior to admission? : Yes Suicidal Intent: Yes-Currently Present Has patient had any suicidal intent within the past 6 months prior to admission? : Yes Is patient at risk for suicide?: Yes Suicidal Plan?: Yes-Currently Present Has patient had any suicidal plan within the past 6 months prior to admission? : Yes Specify Current Suicidal Plan: Pt  overdosed on pain pills Access to Means: Yes Specify Access to Suicidal Means: access to pain pills off the street What has been your use of drugs/alcohol within the last 12 months?: uses crack and pain pills Triggers for Past Attempts: Family contact Intentional Self Injurious Behavior: None Family Suicide History: Unknown Recent stressful life event(s): Other (Comment) Persecutory voices/beliefs?: No Depression: Yes Substance abuse history and/or treatment for substance abuse?: Yes Suicide prevention information given to non-admitted patients: Not applicable  Risk to Others within the past 6 months Homicidal Ideation: No Does patient have any lifetime risk of violence toward others beyond the six months prior to admission? : No Thoughts of Harm to Others: No Current Homicidal Intent: No Current Homicidal Plan: No Access to Homicidal Means: No History of harm to others?: No Assessment of Violence: None Noted Does patient have access to weapons?: No Criminal Charges Pending?: Yes Describe Pending Criminal Charges: simple assault Does patient have a court date: Yes Court Date: 12/09/16 Is patient on probation?: Yes  Psychosis Hallucinations: None noted Delusions: None noted  Mental Status Report Appearance/Hygiene: Unremarkable Eye Contact: Unable to Assess Motor Activity: Unremarkable Speech: Logical/coherent Level of Consciousness: Alert Mood: Euthymic Affect: Depressed, Anxious Anxiety Level: Moderate Thought Processes: Coherent, Relevant Judgement: Partial Orientation: Person, Place, Time, Situation, Appropriate for developmental age Obsessive Compulsive Thoughts/Behaviors: None  Cognitive Functioning Concentration: Normal Memory: Recent Intact, Remote Intact IQ: Average Insight: see judgement above Impulse Control: Fair Sleep: Unable to Assess Vegetative Symptoms: None  ADLScreening [facility] Assessment [facility]) Patient's cognitive ability adequate to safely  complete daily activities?: Yes Patient able to express need for assistance with ADLs?: Yes Independently performs ADLs?: Yes (appropriate for developmental age)  Prior Inpatient Therapy Prior Inpatient Therapy: Yes Prior Therapy Dates: 04/2016 Prior Therapy Facilty/Provider(s): [facility] Reason for Treatment: substance use; depression  Prior Outpatient Therapy Prior Outpatient Therapy: Yes Does patient have an ACCT team?: No Does patient have Intensive In-House Services?  : No Does patient have [facility] services? : No Does patient have P4CC services?: No  ADL Screening (condition at time of admission) Patient's cognitive ability adequate to safely complete daily activities?: Yes Does the patient have difficulty seeing, even when wearing glasses/contacts?: No Does the patient have difficulty concentrating, remembering, or making decisions?: No Patient able to express need for assistance with ADLs?: Yes Does the patient have difficulty dressing or bathing?: No Independently performs ADLs?: Yes (appropriate for developmental age) Does the patient have difficulty walking or climbing stairs?: No Weakness of Legs: None Weakness of Arms/Hands: None  Home Assistive Devices/Equipment Home Assistive Devices/Equipment: None  Therapy Consults (therapy consults require a physician order) PT Evaluation Needed: No OT Evalulation Needed: No SLP Evaluation Needed: No Abuse/Neglect Assessment (Assessment to be complete while patient is alone) Physical Abuse: Denies Verbal Abuse: Denies Sexual Abuse: Denies Exploitation of patient/patient's resources: Denies Self-Neglect: Denies Values / Beliefs Cultural Requests During Hospitalization: None Spiritual Requests During Hospitalization: None Consults Spiritual Care Consult Needed: No Social Work Consult Needed: No Advance  Directives (For Healthcare) Does Patient Have a Medical Advance Directive?: No Would patient like information on  creating a medical advance directive?: No - Patient declined    Additional Information 1:1 In Past 12 Months?: No CIRT Risk: No Elopement Risk: No Does patient have medical clearance?: Yes     Disposition:  Disposition Initial Assessment Completed for this Encounter: Yes (case consulted with Fransisca KaufmannLaura Davis, PMHNP) Disposition of Patient: Inpatient treatment program Type of inpatient treatment program: Adult (pt accepted to 404-1)  On Site Evaluation by:   Reviewed with Physician:    Laddie AquasSamantha M Allayah Raineri 11/02/2016 10:15 AM

## 2016-11-03 DIAGNOSIS — T1491XA Suicide attempt, initial encounter: Secondary | ICD-10-CM

## 2016-11-03 DIAGNOSIS — T39312A Poisoning by propionic acid derivatives, intentional self-harm, initial encounter: Secondary | ICD-10-CM

## 2016-11-03 DIAGNOSIS — F332 Major depressive disorder, recurrent severe without psychotic features: Secondary | ICD-10-CM

## 2016-11-03 DIAGNOSIS — T426X2A Poisoning by other antiepileptic and sedative-hypnotic drugs, intentional self-harm, initial encounter: Secondary | ICD-10-CM

## 2016-11-03 MED ORDER — CITALOPRAM HYDROBROMIDE 20 MG PO TABS
20.0000 mg | ORAL_TABLET | Freq: Every day | ORAL | Status: DC
  Administered 2016-11-03 – 2016-11-05 (×3): 20 mg via ORAL
  Filled 2016-11-03 (×5): qty 1

## 2016-11-03 NOTE — Progress Notes (Signed)
Martha Chen had been up and visible in milieu this evening, did attend and participate in evening group activity. She spoke of her day and the circumstances leading up to her hospitalization. She was able to receive medications without incident and was able to contract for safety. A. Support and encouragement provided. R. Safety maintained, will continue to monitor.

## 2016-11-03 NOTE — Progress Notes (Signed)
Recreation Therapy Notes  Date: 11/03/16 Time: 0930 Location: [address] Dayroom  Group Topic: Stress Management  Goal Area(s) Addresses:  Patient will verbalize importance of using healthy stress management.  Patient will identify positive emotions associated with healthy stress management.   Intervention: Stress Management  Activity :  Progressive Muscle Relaxation.  LRT introduced the stress management technique of progressive muscle relaxation.  LRT read a script to allow patients to participate in PMR which allowed them to tense and relax each muscle group individually.  Patients were to follow along with the script to fully engage in the activity.  Education:  Stress Management, Discharge Planning.   Education Outcome: Acknowledges edcuation/In group clarification offered/Needs additional education  Clinical Observations/Feedback: Pt did not attend group.   Martha Chen, LRT/CTRS         Martha Chen A 11/03/2016 11:48 AM 

## 2016-11-03 NOTE — [facility] Group Notes (Signed)
[facility] Group Therapy 11/03/2016 1:15pm  Type of Therapy: Mental Health Association Presentation  Participation Level: Active  Participation Quality: Attentive  Affect: Appropriate  Cognitive: Oriented  Insight: Developing/Improving  Engagement in Therapy: Engaged  Modes of Intervention: Discussion, Education and Socialization  Summary of Progress/Problems: Mental Health Association (MHA) Speaker came to talk about his personal journey with mental health. The pt processed ways by which to relate to the speaker. MHA speaker provided handouts and educational information pertaining to groups and services offered by the [facility]. Pt was engaged in speaker's presentation and was receptive to resources provided.    Martha BackersLynn B. Beverely Chen, MSW, LCSWA 11/03/2016 3:15 PM

## 2016-11-03 NOTE — [facility] Suicide Risk Assessment (Signed)
[facility]   Nursing information obtained from:  Patient Demographic factors:  Divorced or widowed, Caucasian, Low socioeconomic status, Unemployed Current Mental Status:  Self-harm thoughts Loss Factors:  Loss of significant relationship, Financial problems / change in socioeconomic status Historical Factors:  Prior suicide attempts, Family history of suicide, Family history of mental illness or substance abuse Risk Reduction Factors:  NA  Total Time spent with patient: 45 minutes Principal Problem:  MDD, Diagnosis:   Patient Active Problem List   Diagnosis Date Noted  . Major depressive disorder, recurrent episode (HCC) [F33.9] 11/02/2016  . Drug overdose [T50.901A] 05/22/2016  . Major depression, recurrent (HCC) [F33.9] 05/21/2016  . Seizures (HCC) [R56.9]   . Headache [R51] 04/29/2014  . Seizure (HCC) [R56.9] 04/17/2014  . Smoking [F17.200] 04/17/2014  . Nausea and vomiting [R11.2] 04/17/2014  . Metabolic acidosis [E87.2] 04/17/2014  . Leukocytosis [D72.829] 04/17/2014  . Hypokalemia [E87.6] 04/17/2014    Continued Clinical Symptoms:  Alcohol Use Disorder Identification Test Final Score (AUDIT): 1 The "Alcohol Use Disorders Identification Test", Guidelines for Use in Primary Care, Second Edition.  World Science writerHealth Organization [facility](WHO). Score between 0-7:  no or low risk or alcohol related problems. Score between 8-15:  moderate risk of alcohol related problems. Score between 16-19:  high risk of alcohol related problems. Score 20 or above:  warrants further diagnostic evaluation for alcohol dependence and treatment.   CLINICAL FACTORS:   41 year old female, history of depression, substance abuse, recently overdosed impulsively on Gabapentin and NSAID. Also relapsed on Cocaine.    Psychiatric Specialty Exam: Physical Exam  ROS  Blood pressure 119/67, pulse 64, temperature 98.1 F (36.7 C), temperature source Oral, resp. rate 16, height 5' 2.5"  (1.588 m), weight 77.6 kg (171 lb), SpO2 100 %.Body mass index is 30.78 kg/m.  See admit note MSE    COGNITIVE FEATURES THAT CONTRIBUTE TO RISK:  Closed-mindedness and Loss of executive function    SUICIDE RISK:   Moderate:  Frequent suicidal ideation with limited intensity, and duration, some specificity in terms of plans, no associated intent, good self-control, limited dysphoria/symptomatology, some risk factors present, and identifiable protective factors, including available and accessible social support.  PLAN OF CARE: Patient will be admitted to inpatient psychiatric unit for stabilization and safety. Will provide and encourage milieu participation. Provide medication management and maked adjustments as needed.  Will follow daily.    I certify that inpatient services furnished can reasonably be expected to improve the patient's condition.   Craige CottaFernando A Lennix Kneisel, MD 11/03/2016, 2:27 PM

## 2016-11-03 NOTE — H&P (Signed)
Psychiatric Admission Assessment Adult  Patient Identification: Martha SchleinCrystal Main MRN:  [id number] Date of Evaluation:  11/03/2016 Chief Complaint: " I have been really stressed out"  Principal Diagnosis:  MDD , Suicide Attempt  Diagnosis:   Patient Active Problem List   Diagnosis Date Noted  . Major depressive disorder, recurrent episode (HCC) [F33.9] 11/02/2016  . Drug overdose [T50.901A] 05/22/2016  . Major depression, recurrent (HCC) [F33.9] 05/21/2016  . Seizures (HCC) [R56.9]   . Headache [R51] 04/29/2014  . Seizure (HCC) [R56.9] 04/17/2014  . Smoking [F17.200] 04/17/2014  . Nausea and vomiting [R11.2] 04/17/2014  . Metabolic acidosis [E87.2] 04/17/2014  . Leukocytosis [D72.829] 04/17/2014  . Hypokalemia [E87.6] 04/17/2014   History of Present Illness: 41 year old female, known to our unit from prior psychiatric admission ( 04/2016)  . States " I think I have been doing OK for the most part ". States she has had increased psychosocial stressors, such as having " too many people" in the trailer she is renting, having difficulty financially, states " nobody does anything around the home other than me . Nobody mows the lawn, nobody contributes ". She states her teenaged daughter recently told her she was gay, which was very stressful for patient. She reports she impulsively overdosed on 6/4. States she took several Neurontin and about two Ibuprofen . ( initial chart notes states that she took # 30 300 mgr Neurontin tablets, but she currently states it was much less than that ). States attempt was suicidal in intent.States that one of her stepchildren called 911 and was brought to hospital. At this time she states she is feeling better. States she has been struggling with some depression, but overall had been "OK" over recent weeks. Describes some anhedonia, states sleep and appetite have been normal. States she has not been having suicidal ideations recently until day of overdose, which was  impulsive and unplanned . Of note, has history of cocaine use disorder, she recently relapsed on crack cocaine after several months of sobriety.   Associated Signs/Symptoms: Depression Symptoms:  anhedonia, suicidal attempt, loss of energy/fatigue, (Hypo) Manic Symptoms:  Denies  Anxiety Symptoms:  Reports significant worry related to her stressors Psychotic Symptoms:  denies  PTSD Symptoms: Describes some intrusive memories regarding death of fiance 2 years ago. Total Time spent with patient: 45 minutes  Past Psychiatric History: has had prior psychiatric admissions, most recently 12/17. At the time presented for similar circumstances- depression and impulsive Gabapentin overdose. Has had episodes of psychosis in the past, but not recently. Denies history of violence. Denies mania.   Is the patient at risk to self? Yes.    Has the patient been a risk to self in the past 6 months? Yes.    Has the patient been a risk to self within the distant past? No.  Is the patient a risk to others? No.  Has the patient been a risk to others in the past 6 months? No.  Has the patient been a risk to others within the distant past? No.   Prior Inpatient Therapy: Prior Inpatient Therapy: Yes Prior Therapy Dates: 04/2016 [facility] Reason for Treatment: substance use; depression Prior Outpatient Therapy: Prior Outpatient Therapy: Yes Does patient have an ACCT team?: No Does patient have Intensive In-House Services?  : No Does patient have [facility] services? : No Does patient have P4CC services?: No  Alcohol Screening: 1. How often do you have a drink containing alcohol?: Monthly or less 2. How many  drinks containing alcohol do you have on a typical day when you are drinking?: 1 or 2 3. How often do you have six or more drinks on one occasion?: Never Preliminary Score: 0 9. Have you or someone else been injured as a result of your drinking?: No 10. Has a relative  or friend or a doctor or another health worker been concerned about your drinking or suggested you cut down?: No Alcohol Use Disorder Identification Test Final Score (AUDIT): 1 Brief Intervention: AUDIT score less than 7 or less-screening does not suggest unhealthy drinking-brief intervention not indicated Substance Abuse History in the last 12 months:  Denies history of alcohol use disorder, endorses history of cocaine use disorder, recently relapsed, also reports more remote history of abusing opiates, but not for several months Consequences of Substance Abuse: Denies  Previous Psychotropic Medications:  She has been on Celexa, Trazodone, Vistaril, she states that these medications are helpful, no side effects. Psychological Evaluations:  No  Past Medical History:  Past Medical History:  Diagnosis Date  . Headache 04/29/2014  . Seizures (HCC)     Past Surgical History:  Procedure Laterality Date  . BACK SURGERY    . CHOLECYSTECTOMY    . TUBAL LIGATION     Family History: parents deceased , has 2 siblings  Family History  Problem Relation Age of Onset  . Liver disease Mother   . Sarcoidosis Mother   . Emphysema Father   . Seizures Neg Hx    Family Psychiatric  History: history of depression in family, no suicides in family  Tobacco Screening: Have you used any form of tobacco in the last 30 days? (Cigarettes, Smokeless Tobacco, Cigars, and/or Pipes): Yes Tobacco use, Select all that apply: 5 or more cigarettes per day Are you interested in Tobacco Cessation Medications?: Yes, will notify MD for an order Counseled patient on smoking cessation including recognizing danger situations, developing coping skills and basic information about quitting provided: Refused/Declined practical counseling Social History: married/separated, currently has BF, has two biological children ( 23,15) . 40 year old currently staying with a friend . States she lives in a trailer , with multiple people  staying there at this time, including a stepson, his GF, a step daughter, and other people. Upcoming court date for possession. No current source of income, unemployed . History  Alcohol Use No     History  Drug Use  . Types: Oxycodone, Benzodiazepines    Comment: oxy 60mg  daily and xanax 2mg  daily    Additional Social History: Marital status: Single    Pain Medications: denied Prescriptions: denied Over the Counter: denied History of alcohol / drug use?: Yes Longest period of sobriety (when/how long): just started using earlier this year Negative Consequences of Use: Financial, Legal, Personal relationships Withdrawal Symptoms: Other (Comment) ("I have no idea") Name of Substance 1: cocaine 1 - Age of First Use: unknown 1 - Amount (size/oz): as much as I can get 1 - Frequency: daily 1 - Duration: unknown 1 - Last Use / Amount: unknown Name of Substance 2: Roxy's 2 - Age of First Use: unknown 2 - Amount (size/oz): up to 30mg  2 - Frequency: daily 2 - Duration: January 2018  Allergies:  No Known Allergies Lab Results: No results found for this or any previous visit (from the past 48 hour(s)).  Blood Alcohol level:  Lab Results  Component Value Date   [facility] <11 04/16/2014    Metabolic Disorder Labs:  No results found for: HGBA1C,  MPG No results found for: PROLACTIN No results found for: CHOL, TRIG, HDL, CHOLHDL, VLDL, LDLCALC  Current Medications: Current Facility-Administered Medications  Medication Dose Route Frequency Provider Last Rate Last Dose  . acetaminophen (TYLENOL) tablet 650 mg  650 mg Oral Q6H PRN Armandina Stammer I, NP   650 mg at 11/03/16 0929  . alum & mag hydroxide-simeth (MAALOX/MYLANTA) 200-200-20 MG/5ML suspension 30 mL  30 mL Oral Q4H PRN Nwoko, Agnes I, NP      . hydrOXYzine (ATARAX/VISTARIL) tablet 25 mg  25 mg Oral Q6H PRN Armandina Stammer I, NP   25 mg at 11/03/16 0929  . magnesium hydroxide (MILK OF MAGNESIA) suspension 30 mL  30 mL Oral Daily PRN  Nwoko, Agnes I, NP      . naproxen (NAPROSYN) tablet 500 mg  500 mg Oral BID PRN Truman Hayward, FNP   500 mg at 11/02/16 2137  . nicotine (NICODERM CQ - dosed in mg/24 hours) patch 21 mg  21 mg Transdermal Q0600 Armandina Stammer I, NP   21 mg at 11/03/16 0930  . traZODone (DESYREL) tablet 50 mg  50 mg Oral QHS Armandina Stammer I, NP   50 mg at 11/02/16 2137   PTA Medications: Prescriptions Prior to Admission  Medication Sig Dispense Refill Last Dose  . citalopram (CELEXA) 10 MG tablet Take 1 tablet (10 mg total) by mouth daily. 30 tablet 0 Past Week at Unknown time  . ibuprofen (ADVIL,MOTRIN) 800 MG tablet Take 800 mg by mouth every 8 (eight) hours as needed for moderate pain.   Past Week at Unknown time  . traZODone (DESYREL) 50 MG tablet Take 1 tablet (50 mg total) by mouth at bedtime as needed for sleep. (Patient taking differently: Take 50 mg by mouth at bedtime. ) 30 tablet 0 11/02/2016 at Unknown time  . hydrOXYzine (ATARAX/VISTARIL) 25 MG tablet Take 1 tablet (25 mg total) by mouth every 6 (six) hours as needed for anxiety. (Patient not taking: Reported on 11/03/2016) 30 tablet 0 Not Taking at Unknown time  . nicotine (NICODERM CQ - DOSED IN MG/24 HOURS) 21 mg/24hr patch Place 1 patch (21 mg total) onto the skin daily. (Patient not taking: Reported on 11/03/2016) 28 patch 0 Not Taking at Unknown time  . thiamine 100 MG tablet Take 1 tablet (100 mg total) by mouth daily. (Patient not taking: Reported on 11/03/2016) 30 tablet 0 Not Taking at Unknown time    Musculoskeletal: Strength & Muscle Tone: within normal limits Gait & Station: normal Patient leans: N/A  Psychiatric Specialty Exam: Physical Exam  Review of Systems  Constitutional: Negative.   HENT: Negative.   Eyes: Negative.   Respiratory: Negative.   Cardiovascular: Negative.   Gastrointestinal: Positive for heartburn. Negative for abdominal pain, blood in stool, diarrhea, nausea and vomiting.  Genitourinary: Negative.    Musculoskeletal: Positive for back pain.  Skin: Negative.   Neurological: Negative for seizures.  Endo/Heme/Allergies: Negative.   Psychiatric/Behavioral: Positive for depression, substance abuse and suicidal ideas.  All other systems reviewed and are negative.   Blood pressure 119/67, pulse 64, temperature 98.1 F (36.7 C), temperature source Oral, resp. rate 16, height 5' 2.5" (1.588 m), weight 77.6 kg (171 lb), SpO2 100 %.Body mass index is 30.78 kg/m.  General Appearance: Fairly Groomed  Eye Contact:  Fair  Speech:  Normal Rate  Volume:  Normal  Mood:  Depressed- but states she is feeling better   Affect:  constricted but reactive  Thought Process:  Linear and  Descriptions of Associations: Intact  Orientation:  Full (Time, Place, and Person)  Thought Content:  denies hallucinations, no delusions   Suicidal Thoughts:  No denies any current suicidal ideations, denies any self injurious ideations  Homicidal Thoughts:  No denies any homicidal or violent ideations  Memory:  recent and remote grossly intact   Judgement:  Fair  Insight:  Fair  Psychomotor Activity:  Normal  Concentration:  Concentration: Good and Attention Span: Good  Recall:  Good  Fund of Knowledge:  Good  Language:  Good  Akathisia:  Negative  Handed:  Right  AIMS (if indicated):     Assets:  Communication Skills Desire for Improvement Resilience  ADL's:  Intact  Cognition:  WNL  Sleep:  Number of Hours: 6.75    Treatment Plan Summary: Daily contact with patient to assess and evaluate symptoms and progress in treatment, Medication management, Plan inpatient admission and medications as below  Observation Level/Precautions:  15 minute checks  Laboratory:  as needed   Psychotherapy:  Milieu, group therapy  Medications:  Continue Celexa which she states has been helpful and well tolerated- will increase to 20 mgrs QDAY  Continue Trazodone PRNs for insomnia, VIstaril PRNs for anxiety   Consultations:   As needed   Discharge Concerns:  - housing concerns   Estimated LOS: 5 days   Other:     Physician Treatment Plan for Primary Diagnosis:  MDD, Suicide Attempt Long Term Goal(s): Improvement in symptoms so as ready for discharge  Short Term Goals: Ability to verbalize feelings will improve, Ability to disclose and discuss suicidal ideas, Ability to demonstrate self-control will improve, Ability to identify and develop effective coping behaviors will improve and Ability to maintain clinical measurements within normal limits will improve  Physician Treatment Plan for Secondary Diagnosis: Cocaine Use Disorder Long Term Goal(s): Improvement in symptoms so as ready for discharge  Short Term Goals: Ability to identify triggers associated with substance abuse/mental health issues will improve  I certify that inpatient services furnished can reasonably be expected to improve the patient's condition.    Craige Cotta, MD 6/6/20181:59 PM

## 2016-11-03 NOTE — [facility] Counselor (Signed)
Adult Comprehensive Assessment  Patient ID: Martha Chen, female   DOB: 1976-02-25, 41 y.o.   MRN: [id number]  Information Source: Information source: Patient  Current Stressors:  Educational / Learning stressors: 8th grade education Employment / Job issues: Inconsistent work  Family Relationships: Conflictual relationships with some family members  Surveyor, quantity / Lack of resources (include bankruptcy): [employer] / Lack of housing: Pt has too many people living in her home and it is making her living situation uncomfortable for her  Physical health (include injuries & life threatening diseases): None reported  Social relationships: None reported  Substance abuse: Cocaine use Bereavement / Loss: Pt's boyfriend died in 12/07/2016and pt still struggles with this   Living/Environment/Situation:  Living Arrangements: Non-relatives/Friends, Children, Other relatives Living conditions (as described by patient or guardian): Pt lives in a mobile home with 10 other people. Pt has various family members staying with her and even "a man from off the street" that pt "took in because she wanted to be nice". How long has patient lived in current situation?: 2-3 years  What is atmosphere in current home: Chaotic  Family History:  Marital status: Long term relationship Long term relationship, how long?: 2 years What types of issues is patient dealing with in the relationship?: Pt states that he is her "sugar daddy". Additional relationship information: Boyfriend of 7 years died in May 07, 2015.   Are you sexually active?: No What is your sexual orientation?: straight Does patient have children?: Yes How many children?: 2 How is patient's relationship with their children?: 23yo daughter, 15yo daughter, 2yo granddaughter - Good but could be better, states she wants to work on "being a family again."  Childhood History:  By whom was/is the patient raised?: Other (Comment) (Pt states she  raised herself) Additional childhood history information: "I pretty much raised myself."  Lived with aunt and uncle, grandparents, never with parents.  Mother died when pt was 6yo and in kindergarten.   Description of patient's relationship with caregiver when they were a child: "It was hell."  States she got married at age 5yo to get out of it. How were you disciplined when you got in trouble as a child/adolescent?: "Anything they could whoop me with." Does patient have siblings?: Yes Number of Siblings: 2 Description of patient's current relationship with siblings: 1 brother, 1 sister - all live in different towns, hardly ever see each other, get along Did patient suffer any verbal/emotional/physical/sexual abuse as a child?: No Did patient suffer from severe childhood neglect?: No Has patient ever been sexually abused/assaulted/raped as an adolescent or adult?: No Was the patient ever a victim of a crime or a disaster?: No Witnessed domestic violence?: No Has patient been effected by domestic violence as an adult?: No  Education:  Highest grade of school patient has completed: 8th grade  Currently a student?: No Learning disability?: No  Employment/Work Situation:   Employment situation: Employed 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 How long has patient been employed?: 2 years Patient's job has been impacted by current illness: No What is the longest time patient has a held a job?: 10-15 years 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 Has patient ever been in the [employer]?: No Are There Guns or Other Weapons in Your Home?: No  Financial Resources:   Financial resources: Income from employment, Income from spouse  Alcohol/Substance Abuse:   What has been your use of drugs/alcohol  within the last 12 months?: Crack cocaine use and pain pills  Alcohol/Substance Abuse Treatment Hx: Denies past history Has  alcohol/substance abuse ever caused legal problems?: No  Social Support System:   Patient's Community Support System: Passenger transport managerGood Describe Community Support System: Daughters and boyfriend  Type of faith/religion: None  How does patient's faith help to cope with current illness?: NA  Leisure/Recreation:   Leisure and Hobbies: "Nothing really."  Strengths/Needs:   What things does the patient do well?: "I used to like hiking but can't anymore since I hurt my back" In what areas does patient struggle / problems for patient: Depression, substance use   Discharge Plan:   Does patient have access to transportation?: Yes Will patient be returning to same living situation after discharge?: Yes Currently receiving community mental health services: No If no, would patient like referral for services when discharged?: Yes (What county?) Careers adviser([facility]) Does patient have financial barriers related to discharge medications?: Yes Patient description of barriers related to discharge medications: Limited resources   Summary/Recommendations:     Patient is a 41 yo female who presented to the hospital with substance use and SI. Pt's primary diagnosis is Major Depressive Disorder. Primary triggers for admission include relapsing on cocaine, financial stress, and too many people living in her home. During the time of the assessment pt was alert and oriented, pleasant, and forthcoming with information. [facility] to [facility]. Pt's supports include her daughters and her boyfriend. Patient will benefit from crisis stabilization, medication evaluation, group therapy and pyschoeducation, in addition to case management for discharge planning. At discharge, it is recommended that pt remain compliant with the established discharge plan and continue treatment.   Jonathon JordanLynn B Kanna Dafoe, MSW, Theresia MajorsLCSWA  11/03/2016

## 2016-11-03 NOTE — Progress Notes (Signed)
D: Patient states she is sleeping fair; her appetite is fair.  She reports normal energy and good concentration.  She has been visible in the milieu and has attended one group today.  She denies any thoughts of self harm.  She reports her depression and anxiety as a 4; denies any hopelessness.  Her goal today is to "do better."  Patient reports minimal withdrawal symptoms such as diarrhea and runny nose.  She is observed in the day room interacting well with her peers. A: Continue to monitor medication management and MD orders.  Safety checks completed every 15 minutes per protocol.  Offer support and encouragement as needed. R: Patient is receptive to staff; her behavior is appropriate.

## 2016-11-04 LAB — TSH: TSH: 2.679 u[IU]/mL (ref 0.350–4.500)

## 2016-11-04 NOTE — [facility] Suicide Risk Assessment (Signed)
[facility] INPATIENT:  Family/Significant Other Suicide Prevention Education  Suicide Prevention Education:  Patient Refusal for Family/Significant Other Suicide Prevention Education: The patient Shirlean SchleinCrystal Lentsch has refused to provide written consent for family/significant other to be provided Family/Significant Other Suicide Prevention Education during admission and/or prior to discharge.  Physician notified.  Jonathon JordanLynn B Vernita Tague, MSW, LCSWA  11/04/2016, 11:42 AM

## 2016-11-04 NOTE — Plan of Care (Signed)
Problem: Coping: Goal: Ability to cope will improve Outcome: Progressing Patient reports improved depressive symptoms.  She denies any thoughts of self harm.  She feels she is ready for discharge.

## 2016-11-04 NOTE — Progress Notes (Signed)
Adult Psychoeducational Group Note  Date:  11/04/2016 Time:  1:03 AM  Group Topic/Focus:  Wrap-Up Group:   The focus of this group is to help patients review their daily goal of treatment and discuss progress on daily workbooks.  Participation Level:  Active  Participation Quality:  Appropriate  Affect:  Appropriate  Cognitive:  Alert and Appropriate  Insight: Appropriate and Good  Engagement in Group:  Engaged  Modes of Intervention:  Discussion  Additional Comments:  Patient stated her goal for today was to take the treatment serious and get better. Patient stated if felt good when she achieved her goal today. Patient rated her day was a 9 out of 10 for today. Patient stated something positive that happen today was she was able to contact her family and enjoyed going outside with peers for recreation. Patient stated stated her goal for tomorrow was to work on discharge plan.  Felipa FurnaceChristopher  Shanen Norris 11/04/2016, 1:03 AM

## 2016-11-04 NOTE — Progress Notes (Signed)
D. Lilit had been up and visible in milieu this evening, did attend evening group activity, she spoke of her day and spoke about feeling better than when she came in. She also spoke about how she is hopeful for discharge soon. She was able to receive bedtime medications without incident. A. Support and encouragement provided. R. Safety maintained, will continue to monitor.

## 2016-11-04 NOTE — Progress Notes (Signed)
[facility] MD Progress Note  11/04/2016 2:30 PM Martha Chen  MRN:  [id number] Subjective:  Patient states she is feeling much better. At this time her goal is to be discharged home soon.  She minimizes depression at this time, and states " I spoke with some of the people that have been living in my place and told them they need to find somewhere else to live". She feels she has been able to appropriately address her stressors. Denies medication side effects. Denies any suicidal ideations. Objective : I have discussed case with treatment team and have met with patient. She presents improved compared to admission presentation- mood is improving, affect is brighter, and she is future oriented. At this time denies suicidal ideations , and is future oriented . Denies any cravings for cocaine and states she is insightful about the importance of avoiding cocaine after discharge and the negative impact it has on her mood. No disruptive or agitated behaviors on unit, going to some groups, pleasant on approach. Denies medication side effects.  Principal Problem: Depression, Cocaine Use Disorder Diagnosis:   Patient Active Problem List   Diagnosis Date Noted  . Major depressive disorder, recurrent episode ([place]) [F33.9] 11/02/2016  . Drug overdose [T50.901A] 05/22/2016  . Major depression, recurrent ([place]) [F33.9] 05/21/2016  . Seizures ([place]) [R56.9]   . Headache [R51] 04/29/2014  . Seizure ([place]) [R56.9] 04/17/2014  . Smoking [F17.200] 04/17/2014  . Nausea and vomiting [R11.2] 04/17/2014  . Metabolic acidosis [I62.7] 04/17/2014  . Leukocytosis [D72.829] 04/17/2014  . Hypokalemia [E87.6] 04/17/2014   Total Time spent with patient: 20 minutes  Past Medical History:  Past Medical History:  Diagnosis Date  . Headache 04/29/2014  . Seizures ([place])     Past Surgical History:  Procedure Laterality Date  . BACK SURGERY    . CHOLECYSTECTOMY    . TUBAL LIGATION     Family History:  Family History   Problem Relation Age of Onset  . Liver disease Mother   . Sarcoidosis Mother   . Emphysema Father   . Seizures Neg Hx    Social History:  History  Alcohol Use No     History  Drug Use  . Types: Oxycodone, Benzodiazepines    Comment: oxy 54m daily and xanax 248mdaily    Social History   Social History  . Marital status: Married    Spouse name: N/A  . Number of children: 2  . Years of education: 7th grade   Social History Main Topics  . Smoking status: Current Every Day Smoker    Packs/day: 1.00    Types: Cigarettes  . Smokeless tobacco: Never Used  . Alcohol use No  . Drug use: Yes    Types: Oxycodone, Benzodiazepines     Comment: oxy 6027maily and xanax 2mg36mily  . Sexual activity: Not Asked   Other Topics Concern  . None   Social History Narrative   Patient is separated    Patient is right handed.   Patient drinks 3-4 sodas daily.      Additional Social History:    Pain Medications: denied Prescriptions: denied Over the Counter: denied History of alcohol / drug use?: Yes Longest period of sobriety (when/how long): just started using earlier this year Negative Consequences of Use: Financial, Legal, Personal relationships Withdrawal Symptoms: Other (Comment) ("I have no idea") Name of Substance 1: cocaine 1 - Age of First Use: unknown 1 - Amount (size/oz): as much as I can get 1 - Frequency:  daily 1 - Duration: unknown 1 - Last Use / Amount: unknown Name of Substance 2: Roxy's 2 - Age of First Use: unknown 2 - Amount (size/oz): up to 35m 2 - Frequency: daily 2 - Duration: January 2018  Sleep: Good  Appetite:  Good  Current Medications: Current Facility-Administered Medications  Medication Dose Route Frequency Provider Last Rate Last Dose  . acetaminophen (TYLENOL) tablet 650 mg  650 mg Oral Q6H PRN NLindell SparI, NP   650 mg at 11/04/16 1016  . alum & mag hydroxide-simeth (MAALOX/MYLANTA) 200-200-20 MG/5ML suspension 30 mL  30 mL Oral Q4H  PRN Nwoko, Agnes I, NP      . citalopram (CELEXA) tablet 20 mg  20 mg Oral Daily Jomes Giraldo A, MD   20 mg at 11/04/16 0759  . hydrOXYzine (ATARAX/VISTARIL) tablet 25 mg  25 mg Oral Q6H PRN NLindell SparI, NP   25 mg at 11/04/16 0800  . magnesium hydroxide (MILK OF MAGNESIA) suspension 30 mL  30 mL Oral Daily PRN Nwoko, Agnes I, NP      . naproxen (NAPROSYN) tablet 500 mg  500 mg Oral BID PRN SNanci Pina FNP   500 mg at 11/03/16 2117  . nicotine (NICODERM CQ - dosed in mg/24 hours) patch 21 mg  21 mg Transdermal Q0600 NLindell SparI, NP   21 mg at 11/04/16 0757  . traZODone (DESYREL) tablet 50 mg  50 mg Oral QHS NLindell SparI, NP   50 mg at 11/03/16 2117    Lab Results:  Results for orders placed or performed during the hospital encounter of 11/02/16 (from the past 48 hour(s))  TSH     Status: None   Collection Time: 11/04/16  6:51 AM  Result Value Ref Range   TSH 2.679 0.350 - 4.500 uIU/mL    Comment: Performed by a 3rd Generation assay with a functional sensitivity of <=0.01 uIU/mL. [facility] [address] [place] [address]    Blood Alcohol level:  Lab Results  Component Value Date   [facility] 159/74/1638   Metabolic Disorder Labs: No results found for: HGBA1C, MPG No results found for: PROLACTIN No results found for: CHOL, TRIG, HDL, CHOLHDL, VLDL, LDLCALC  Physical Findings: AIMS: Facial and Oral Movements Muscles of Facial Expression: None, normal Lips and Perioral Area: None, normal Jaw: None, normal Tongue: None, normal,Extremity Movements Upper (arms, wrists, hands, fingers): None, normal Lower (legs, knees, ankles, toes): None, normal, Trunk Movements Neck, shoulders, hips: None, normal, Overall Severity Severity of abnormal movements (highest score from questions above): None, normal Incapacitation due to abnormal movements: None, normal Patient's awareness of abnormal movements (rate only patient's report):  No Awareness, Dental Status Current problems with teeth and/or dentures?: No Does patient usually wear dentures?: No  CIWA:    COWS:  COWS Total Score: 0  Musculoskeletal: Strength & Muscle Tone: within normal limits Gait & Station: normal Patient leans: N/A  Psychiatric Specialty Exam: Physical Exam  ROS denies chest pain, denies shortness of breath, denies vomiting , no fever  Blood pressure 113/74, pulse 78, temperature 98 F (36.7 C), temperature source Oral, resp. rate 16, height 5' 2.5" (1.588 m), weight 77.6 kg (171 lb), SpO2 100 %.Body mass index is 30.78 kg/m.  General Appearance: improving grooming   Eye Contact:  Good  Speech:  Normal Rate  Volume:  Normal  Mood:  improving mood, currently minimizes depression  Affect:  Appropriate and fuller  in range   Thought Process:  Linear and Descriptions of Associations: Intact  Orientation:  Full (Time, Place, and Person)  Thought Content:  denies hallucinations, no delusions , not internally preoccupied   Suicidal Thoughts:  No denies suicidal or self injurious ideations, denies homicidal or violent ideations   Homicidal Thoughts:  No  Memory:  recent and remote grossly intact   Judgement:  Other:  improving   Insight:  Fair and improving  Psychomotor Activity:  Normal  Concentration:  Concentration: Good and Attention Span: Good  Recall:  Good  Fund of Knowledge:  Good  Language:  Good  Akathisia:  Negative  Handed:  Right  AIMS (if indicated):     Assets:  Communication Skills Desire for Improvement Resilience  ADL's:  Intact  Cognition:  WNL  Sleep:  Number of Hours: 6.75   Assessment - patient reports feeling better and at this time minimizes depression or anxiety. She is future oriented. She is tolerating medications well . As she improves she is starting to focus more on being discharged soon.  Treatment Plan Summary: Daily contact with patient to assess and evaluate symptoms and progress in treatment,  Medication management, Plan inpatient treatment  and medications as below Continue to encourage group and milieu participation to work on coping skills and symptom reduction Continue to encourage efforts to work on sobriety and relapse prevention  Continue Celexa 20 mgrs QDAY for depression, anxiety Continue Vistaril 25 mgrs Q 6 hours PRN for anxiety Continue Trazodone 50 mgrs QHS for insomnia  Treatment team working on disposition [place], MD 11/04/2016, 2:30 PM

## 2016-11-04 NOTE — Tx Team (Signed)
Interdisciplinary Treatment and Diagnostic Plan Update 11/04/2016 Time of Session: 9:30am  Martha Chen  MRN: [id number]  Principal Diagnosis: MDD , Suicide Attempt   Secondary Diagnoses: Active Problems:   Major depressive disorder, recurrent episode ([name])   Current Medications:  Current Facility-Administered Medications  Medication Dose Route Frequency Provider Last Rate Last Dose  . acetaminophen (TYLENOL) tablet 650 mg  650 mg Oral Q6H PRN Lindell Spar I, NP   650 mg at 11/03/16 0929  . alum & mag hydroxide-simeth (MAALOX/MYLANTA) 200-200-20 MG/5ML suspension 30 mL  30 mL Oral Q4H PRN Nwoko, Agnes I, NP      . citalopram (CELEXA) tablet 20 mg  20 mg Oral Daily Cobos, Fernando A, MD   20 mg at 11/04/16 0759  . hydrOXYzine (ATARAX/VISTARIL) tablet 25 mg  25 mg Oral Q6H PRN Lindell Spar I, NP   25 mg at 11/04/16 0800  . magnesium hydroxide (MILK OF MAGNESIA) suspension 30 mL  30 mL Oral Daily PRN Nwoko, Agnes I, NP      . naproxen (NAPROSYN) tablet 500 mg  500 mg Oral BID PRN Nanci Pina, FNP   500 mg at 11/03/16 2117  . nicotine (NICODERM CQ - dosed in mg/24 hours) patch 21 mg  21 mg Transdermal Q0600 Lindell Spar I, NP   21 mg at 11/04/16 0757  . traZODone (DESYREL) tablet 50 mg  50 mg Oral QHS Lindell Spar I, NP   50 mg at 11/03/16 2117    PTA Medications: Prescriptions Prior to Admission  Medication Sig Dispense Refill Last Dose  . citalopram (CELEXA) 10 MG tablet Take 1 tablet (10 mg total) by mouth daily. 30 tablet 0 Past Week at Unknown time  . ibuprofen (ADVIL,MOTRIN) 800 MG tablet Take 800 mg by mouth every 8 (eight) hours as needed for moderate pain.   Past Week at Unknown time  . traZODone (DESYREL) 50 MG tablet Take 1 tablet (50 mg total) by mouth at bedtime as needed for sleep. (Patient taking differently: Take 50 mg by mouth at bedtime. ) 30 tablet 0 11/02/2016 at Unknown time  . hydrOXYzine (ATARAX/VISTARIL) 25 MG tablet Take 1 tablet (25 mg total) by mouth every 6  (six) hours as needed for anxiety. (Patient not taking: Reported on 11/03/2016) 30 tablet 0 Not Taking at Unknown time  . nicotine (NICODERM CQ - DOSED IN MG/24 HOURS) 21 mg/24hr patch Place 1 patch (21 mg total) onto the skin daily. (Patient not taking: Reported on 11/03/2016) 28 patch 0 Not Taking at Unknown time  . thiamine 100 MG tablet Take 1 tablet (100 mg total) by mouth daily. (Patient not taking: Reported on 11/03/2016) 30 tablet 0 Not Taking at Unknown time    Treatment Modalities: Medication Management, Group therapy, Case management,  1 to 1 session with clinician, Psychoeducation, Recreational therapy.  Patient Stressors: Financial difficulties Medication change or noncompliance Substance abuse Patient Strengths: Network engineer for treatment/growth  Physician Treatment Plan for Primary Diagnosis: MDD , Suicide Attempt  Long Term Goal(s): Improvement in symptoms so as ready for discharge Short Term Goals: Ability to verbalize feelings will improve Ability to disclose and discuss suicidal ideas Ability to demonstrate self-control will improve Ability to identify and develop effective coping behaviors will improve Ability to maintain clinical measurements within normal limits will improve Ability to identify triggers associated with substance abuse/mental health issues will improve  Medication Management: Evaluate patient's response, side effects, and tolerance of medication regimen.  Therapeutic Interventions:  1 to 1 sessions, Unit Group sessions and Medication administration.  Evaluation of Outcomes: Not Met  Physician Treatment Plan for Secondary Diagnosis: Active Problems:   Major depressive disorder, recurrent episode ([name])  Long Term Goal(s): Improvement in symptoms so as ready for discharge  Short Term Goals: Ability to verbalize feelings will improve Ability to disclose and discuss suicidal ideas Ability to demonstrate  self-control will improve Ability to identify and develop effective coping behaviors will improve Ability to maintain clinical measurements within normal limits will improve Ability to identify triggers associated with substance abuse/mental health issues will improve  Medication Management: Evaluate patient's response, side effects, and tolerance of medication regimen.  Therapeutic Interventions: 1 to 1 sessions, Unit Group sessions and Medication administration.  Evaluation of Outcomes: Not Met  RN Treatment Plan for Primary Diagnosis: MDD , Suicide Attempt  Long Term Goal(s): Knowledge of disease and therapeutic regimen to maintain health will improve  Short Term Goals: Ability to remain free from injury will improve and Compliance with prescribed medications will improve  Medication Management: RN will administer medications as ordered by provider, will assess and evaluate patient's response and provide education to patient for prescribed medication. RN will report any adverse and/or side effects to prescribing provider.  Therapeutic Interventions: 1 on 1 counseling sessions, Psychoeducation, Medication administration, Evaluate responses to treatment, Monitor vital signs and CBGs as ordered, Perform/monitor CIWA, COWS, AIMS and Fall Risk screenings as ordered, Perform wound care treatments as ordered.  Evaluation of Outcomes: Not Met  LCSW Treatment Plan for Primary Diagnosis: MDD , Suicide Attempt  Long Term Goal(s): Safe transition to appropriate next level of care at discharge, Engage patient in therapeutic group addressing interpersonal concerns. Short Term Goals: Engage patient in aftercare planning with referrals and resources, Facilitate patient progression through stages of change regarding substance use diagnoses and concerns, Identify triggers associated with mental health/substance abuse issues and Increase skills for wellness and recovery  Therapeutic Interventions: Assess  for all discharge needs, 1 to 1 time with Social worker, Explore available resources and support systems, Assess for adequacy in community support network, Educate family and significant other(s) on suicide prevention, Complete Psychosocial Assessment, Interpersonal group therapy.  Evaluation of Outcomes: Not Met  Progress in Treatment: Attending groups: Pt is new to milieu, continuing to assess  Participating in groups: Pt is new to milieu, continuing to assess  Taking medication as prescribed: Yes, MD continues to assess for medication changes as needed Toleration medication: Yes, no side effects reported at this time Family/Significant other contact made: No, CSW assessing for appropriate contact Patient understands diagnosis: Continuing to assess Discussing patient identified problems/goals with staff: Yes Medical problems stabilized or resolved: Yes Denies suicidal/homicidal ideation: No, pt recently admitted with SI. Issues/concerns per patient self-inventory: None Other: N/A  New problem(s) identified: None identified at this time.   New Short Term/Long Term Goal(s): None identified at this time.   Discharge Plan or Barriers: Pt will return home and follow up outpatient with [facility].  Reason for Continuation of Hospitalization:  Depression Medication stabilization Suicidal ideation Withdrawal symptoms  Estimated Length of Stay: 3-5 days  Attendees: Patient: 11/04/2016 8:46 AM  Physician: Dr. Parke Poisson 11/04/2016 8:46 AM  Nursing: Chrys Racer RN; Opal Sidles, RN 11/04/2016 8:46 AM  RN Care Manager: Lars Pinks, RN 11/04/2016 8:46 AM  Social Worker: Matthew Saras, [place] 11/04/2016 8:46 AM  Recreational Therapist:  11/04/2016 8:46 AM  Other: Lindell Spar, NP 11/04/2016 8:46 AM  Other:  11/04/2016 8:46 AM  Other: 11/04/2016  8:46 AM  Scribe for Treatment Team: Georga Kaufmann, MSW,LCSWA 11/04/2016 8:46 AM

## 2016-11-04 NOTE — Progress Notes (Signed)
D: Patient states that her mood has improved; her affect is brighter.  Patient denies any depressive symptoms today and hopes to be discharged.  She rates her depression and hopelessness as a 0; anxiety as a 4.  She denies any thoughts of self harm.  Her goal today is to "go home."  Patient states she is "having a good day so far."  She is sleeping and eating well; her energy is normal and her concentration is good.   A: Continue to monitor medication management and MD orders.  Safety checks completed every 15 minutes per protocol.  Offer support and encouragement as needed. R: Patient is receptive to staff; her behavior is appropriate.

## 2016-11-04 NOTE — [facility] Group Notes (Signed)
[facility] LCSW Group Therapy 11/04/2016 1:15pm  Type of Therapy: Group Therapy- [facility]  Participation Level: Pt was present for the duration of the group. Did not participate in the discussion.   Jonathon JordanLynn B Robertta Halfhill, MSW, Theresia MajorsLCSWA [phone] 11/04/2016 4:37 PM

## 2016-11-05 MED ORDER — HYDROXYZINE HCL 25 MG PO TABS: 25.0000 mg | ORAL_TABLET | Freq: Four times a day (QID) | ORAL | 0 refills | Status: DC | PRN

## 2016-11-05 MED ORDER — NICOTINE 21 MG/24HR TD PT24: 21.0000 mg | MEDICATED_PATCH | Freq: Every day | TRANSDERMAL | 0 refills | Status: DC

## 2016-11-05 MED ORDER — TRAZODONE HCL 50 MG PO TABS: 50.0000 mg | ORAL_TABLET | Freq: Every day | ORAL | 0 refills | Status: DC

## 2016-11-05 MED ORDER — CITALOPRAM HYDROBROMIDE 20 MG PO TABS: 20.0000 mg | ORAL_TABLET | Freq: Every day | ORAL | 0 refills | Status: DC

## 2016-11-05 NOTE — Progress Notes (Signed)
  [facility]  Will you be returning to the same living situation after discharge:  Yes,  pt returning home. At discharge, do you have transportation home?: Yes,  pt has access to transportation. Do you have the ability to pay for your medications: Yes,  pt has insurance.  Release of information consent forms completed and in the chart;  Patient's signature needed at discharge.  Patient to Follow up at: Follow-up Information    [facility] Follow up on 11/09/2016.   Why:  Hospital follow up appointment @ 10:15am. Please bring your insurance card, photo ID, social secuirty card, and proof of income if you have it. Thank you. Contact information: [address] [place] [id number] [phone]           Next level of care provider has [facility] Link:no  Safety Planning and Suicide Prevention discussed: Yes,  with pt.  Have you used any form of tobacco in the last 30 days? (Cigarettes, Smokeless Tobacco, Cigars, and/or Pipes): Yes  Has patient been referred to the Quitline?: Patient refused referral  Patient has been referred for addiction treatment: Yes  Martha Chen, MSW, LCSWA  11/05/2016, 10:53 AM

## 2016-11-05 NOTE — Progress Notes (Addendum)
Martha Chen is seen OOB UAL on the 400 hall, as she is prepared to be discharged. SHe completed her daily assessment and on this she wrote she denied SI today and she rated her depression, hopelessness and anxeity " 7/5/7", respectively. All belongings are returned to her from her locker and she is given her dc instructions. Cc of these instructions are given to her by this wirter ( AVS, SRA, SSP and and transitions record  ) are all given to pt. D Pt do'd in stable condition.

## 2016-11-05 NOTE — Discharge Summary (Signed)
Physician Discharge Summary Note  Patient:  Martha Chen is an 41 y.o., female MRN:  [id number] DOB:  Jul 27, 1975 Patient phone:  [phone] (home)  Patient address:   [address] [place] [address],  Total Time spent with patient: Greater than 30 minutes  Date of Admission:  11/02/2016  Date of Discharge: 11-05-16  Reason for Admission: Suicide attempt by overdose.   Principal Problem: Major depressive disorder, recurrent episode  Discharge Diagnoses: Patient Active Problem List   Diagnosis Date Noted  . Major depressive disorder, recurrent episode ([place]) [F33.9] 11/02/2016  . Drug overdose [T50.901A] 05/22/2016  . Major depression, recurrent ([place]) [F33.9] 05/21/2016  . Seizures ([place]) [R56.9]   . Headache [R51] 04/29/2014  . Seizure ([place]) [R56.9] 04/17/2014  . Smoking [F17.200] 04/17/2014  . Nausea and vomiting [R11.2] 04/17/2014  . Metabolic acidosis [I69.6] 04/17/2014  . Leukocytosis [D72.829] 04/17/2014  . Hypokalemia [E87.6] 04/17/2014   Past Psychiatric History: Major depressive disorder, Substance use disorder.  Past Medical History:  Past Medical History:  Diagnosis Date  . Headache 04/29/2014  . Seizures ([place])     Past Surgical History:  Procedure Laterality Date  . BACK SURGERY    . CHOLECYSTECTOMY    . TUBAL LIGATION     Family History:  Family History  Problem Relation Age of Onset  . Liver disease Mother   . Sarcoidosis Mother   . Emphysema Father   . Seizures Neg Hx    Family Psychiatric  History: See Md's SRA  Social History:  History  Alcohol Use No     History  Drug Use  . Types: Oxycodone, Benzodiazepines    Comment: oxy 20m daily and xanax 235mdaily    Social History   Social History  . Marital status: Married    Spouse name: N/A  . Number of children: 2  . Years of education: 7th grade   Social History Main Topics  . Smoking status: Current Every Day Smoker    Packs/day: 1.00    Types: Cigarettes  . Smokeless  tobacco: Never Used  . Alcohol use No  . Drug use: Yes    Types: Oxycodone, Benzodiazepines     Comment: oxy 6035maily and xanax 2mg59mily  . Sexual activity: Not Asked   Other Topics Concern  . None   Social History Narrative   Patient is separated    Patient is right handed.   Patient drinks 3-4 sodas daily.      Hospital Course: (Per admission notes): Martha Chen 40 y35r old female, known to our unit from prior psychiatric admission ( 04/2016)  . States " I think I have been doing OK for the most part ". States she has had increased psychosocial stressors, such as having " too many people" in the trailer she is renting, having difficulty financially, states " nobody does anything around the home other than me . Nobody mows the lawn, nobody contributes ". She states her teenaged daughter recently told her she was gay, which was very stressful for patient. She reports she impulsively overdosed on 6/4. States she took several Neurontin and about two Ibuprofen . ( initial chart notes states that she took # 30 300 mgr Neurontin tablets, but she currently states it was much less than that ). States attempt was suicidal in intent.States that one of her stepchildren called 911 and was brought to hospital. At this time she states she is feeling better.  After evaluation of her presenting symptoms, Martha Chen was started on  medication regimen targeting those symptoms. She had cited familial stressors as well as financial responsibilities as the trigger. She was in need of mood stabilization treatments. She received & was discharged on Hydroxyzine 25 mg prn for anxiety, Nicotine 21 mg for smoking cessation, Citalopram 20 mg mg for depression & trazodone 50 mg for insomnia. She was also enrolled in the group counseling sessions/activities being held on this unit to help her learn coping skills that will aid her maintaining mood stability after discharge. She attended and participated in these activities as  recommended.  Martha Chen received no other medication regimen as she presented no other medical issues. She tolerated his treatment regimen without any significant adverse effects and or reactions reported. Patient's symptoms did respond adequately to her treatment plan. This is evidenced by her reports of improved mood, presentation of good affect and reports of symptom reduction.   Martha Chen met with her attending physician this a. m. Her treatment plan, reasons for admission and response to treatment regimen discussed. Martha Chen endorsed that she is doing well and ready to be discharged to her home with family. It was agreed upon that she will continue psychiatric care on an outpatient basis as noted below. She was provided with all the necessary information needed to make this appointment without problems. Upon discharge, she adamantly denies any SIHI, AVH, delusional thoughts or paranoia. She left [facility] with all personal belongings in no apparent distress. Transportation per family.  Physical Findings: AIMS: Facial and Oral Movements Muscles of Facial Expression: None, normal Lips and Perioral Area: None, normal Jaw: None, normal Tongue: None, normal,Extremity Movements Upper (arms, wrists, hands, fingers): None, normal Lower (legs, knees, ankles, toes): None, normal, Trunk Movements Neck, shoulders, hips: None, normal, Overall Severity Severity of abnormal movements (highest score from questions above): None, normal Incapacitation due to abnormal movements: None, normal Patient's awareness of abnormal movements (rate only patient's report): No Awareness, Dental Status Current problems with teeth and/or dentures?: No Does patient usually wear dentures?: No  CIWA:    COWS:  COWS Total Score: 0  Musculoskeletal: Strength & Muscle Tone: within normal limits Gait & Station: normal Patient leans: N/A  Psychiatric Specialty Exam: Physical Exam  Constitutional: She is oriented to person, place,  and time. She appears well-developed.  HENT:  Head: Normocephalic.  Eyes: Pupils are equal, round, and reactive to light.  Neck: Normal range of motion.  Cardiovascular: Normal rate.   Respiratory: Effort normal.  GI: Soft.  Genitourinary:  Genitourinary Comments: Deferred  Musculoskeletal: Normal range of motion.  Neurological: She is alert and oriented to person, place, and time.  Skin: Skin is warm and dry.    Review of Systems  Constitutional: Negative.   HENT: Negative.   Eyes: Negative.   Respiratory: Negative.   Cardiovascular: Negative.   Gastrointestinal: Negative.   Genitourinary: Negative.   Musculoskeletal: Negative.   Skin: Negative.   Neurological: Negative.   Endo/Heme/Allergies: Negative.   Psychiatric/Behavioral: Positive for depression (Stable). Negative for hallucinations, memory loss, substance abuse and suicidal ideas. The patient has insomnia (Stable). The patient is not nervous/anxious.   All other systems reviewed and are negative.   Blood pressure 123/69, pulse 73, temperature 98.1 F (36.7 C), resp. rate 16, height 5' 2.5" (1.588 m), weight 77.6 kg (171 lb), SpO2 100 %.Body mass index is 30.78 kg/m.  See Md's SRA Have you used any form of tobacco in the last 30 days? (Cigarettes, Smokeless Tobacco, Cigars, and/or Pipes): Yes  Has this patient used any  form of tobacco in the last 30 days? (Cigarettes, Smokeless Tobacco, Cigars, and/or Pipes): Yes, provided with a nicotine patch prescription for smoking cessation.   Blood Alcohol level:  Lab Results  Component Value Date   [facility] <11 72/01/4708   Metabolic Disorder Labs:  No results found for: HGBA1C, MPG No results found for: PROLACTIN No results found for: CHOL, TRIG, HDL, CHOLHDL, VLDL, LDLCALC  See Psychiatric Specialty Exam and Suicide Risk Assessment completed by Attending Physician prior to discharge.  Discharge destination:  Home  Is patient on multiple antipsychotic therapies at  discharge:  No   Has Patient had three or more failed trials of antipsychotic monotherapy by history:  No  Recommended Plan for Multiple Antipsychotic Therapies: NA  Allergies as of 11/05/2016   No Known Allergies     Medication List    STOP taking these medications   ibuprofen 800 MG tablet Commonly known as:  ADVIL,MOTRIN   thiamine 100 MG tablet     TAKE these medications     Indication  citalopram 20 MG tablet Commonly known as:  CELEXA Take 1 tablet (20 mg total) by mouth daily. For depression Start taking on:  11/06/2016 What changed:  medication strength  how much to take  additional instructions  Indication:  Depression   hydrOXYzine 25 MG tablet Commonly known as:  ATARAX/VISTARIL Take 1 tablet (25 mg total) by mouth every 6 (six) hours as needed for anxiety.  Indication:  Anxiety Neurosis   nicotine 21 mg/24hr patch Commonly known as:  NICODERM CQ - dosed in mg/24 hours Place 1 patch (21 mg total) onto the skin daily at 6 (six) AM. For smoking cessation Start taking on:  11/06/2016 What changed:  when to take this  additional instructions  Indication:  Nicotine Addiction   traZODone 50 MG tablet Commonly known as:  DESYREL Take 1 tablet (50 mg total) by mouth at bedtime. For sleep What changed:  when to take this  reasons to take this  additional instructions  Indication:  [place] Follow up on 11/09/2016.   Why:  Hospital follow up appointment @ 10:15am. Please bring your insurance card, photo ID, social secuirty card, and proof of income if you have it. Thank you. Contact information: [place] [address] [phone]          Follow-up recommendations:  Activity:  As tolerated Diet: As recommended by your primary care doctor. Keep all scheduled follow-up appointments as recommended.  Comments:  Patient is instructed prior to discharge to: Take all  medications as prescribed by his/her mental healthcare provider. Report any adverse effects and or reactions from the medicines to his/her outpatient provider promptly. Patient has been instructed & cautioned: To not engage in alcohol and or illegal drug use while on prescription medicines. In the event of worsening symptoms, patient is instructed to call the crisis hotline, 911 and or go to the nearest ED for appropriate evaluation and treatment of symptoms. To follow-up with his/her primary care provider for your other medical issues, concerns and or health care needs.   Signed: Encarnacion Slates, NP, PMHNP, FNP-BC 11/05/2016, 10:23 AM  Patient seen, Suicide Assessment Completed.  Disposition Plan Reviewed

## 2016-11-05 NOTE — Progress Notes (Signed)
[facility] [phone]). CPS intake worker states that report needs to be made with [place] CSW called [place] DSS at [phone]). CSW made a CPS report about concerns of the care of pt's 40 yo daughter with intake worker, Vernona RiegerLaura.   Jonathon JordanLynn B Sherlyn Ebbert, MSW, Theresia MajorsLCSWA [phone]

## 2016-11-05 NOTE — [facility] Suicide Risk Assessment (Signed)
[facility]   Principal Problem: Depression,Cocaine Use Disorder  Discharge Diagnoses:  Patient Active Problem List   Diagnosis Date Noted  . Major depressive disorder, recurrent episode ([facility]) [F33.9] 11/02/2016  . Drug overdose [T50.901A] 05/22/2016  . Major depression, recurrent ([facility]) [F33.9] 05/21/2016  . Seizures ([facility]) [R56.9]   . Headache [R51] 04/29/2014  . Seizure ([facility]) [R56.9] 04/17/2014  . Smoking [F17.200] 04/17/2014  . Nausea and vomiting [R11.2] 04/17/2014  . Metabolic acidosis [E87.2] 04/17/2014  . Leukocytosis [D72.829] 04/17/2014  . Hypokalemia [E87.6] 04/17/2014    Total Time spent with patient: 30 minutes  Musculoskeletal: Strength & Muscle Tone: within normal limits Gait & Station: normal Patient leans: N/A  Psychiatric Specialty Exam: ROS denies headache, denies chest pain, no shortness of breath, no vomiting   Blood pressure 123/69, pulse 73, temperature 98.1 F (36.7 C), resp. rate 16, height 5' 2.5" (1.588 m), weight 77.6 kg (171 lb), SpO2 100 %.Body mass index is 30.78 kg/m.  General Appearance: improved grooming   Eye Contact::  Good  Speech:  Normal Rate409  Volume:  Normal  Mood:  improved mood , denies feeling depressed at this time, presents euthymic   Affect:  Appropriate and Full Range  Thought Process:  Linear and Descriptions of Associations: Intact  Orientation:  Full (Time, Place, and Person)  Thought Content:  denies hallucinations, no delusions, not internally preoccupied   Suicidal Thoughts:  No denies suicidal or self injurious ideations, denies any homicidal or violent ideations   Homicidal Thoughts:  No  Memory:  recent and remote grossly intact   Judgement:   improving   Insight:  fair - improving   Psychomotor Activity:  Normal  Concentration:  Good  Recall:  Good  Fund of Knowledge:Good  Language: Good  Akathisia:  Negative  Handed:  Right  AIMS (if indicated):     Assets:  Communication  Skills Desire for Improvement Resilience  Sleep:  Number of Hours: 6.25  Cognition: WNL  ADL's:  Intact   Mental Status Per Nursing Assessment::   On Admission:  Self-harm thoughts  Demographic Factors:  41 year old female , separated, has 2 daughters, unemployed   Loss Factors: Fiance passed away 2 years ago, multiple people living in the trailer she rents, recent relapse   Historical Factors: History of depression, history of prior overdose, history of cocaine use disorder   Risk Reduction Factors:   Responsible for children under 40 years of age, Living with another person, especially a relative and Positive coping skills or problem solving skills  Continued Clinical Symptoms:  At this time patient is alert, attentive, well related, mood is improving compared to admission and at this time denies feeling depressed, affect is full in range, brighter, no thought disorder, no suicidal or self injurious ideations, no homicidal or violent ideations, future oriented. Denies medication side effects. Reports feeling motivated in sobriety, recovery efforts. Behavior on unit in good control, pleasant on approach. Of note, patient aware that staff contacted DSS and made CPS report.    Cognitive Features That Contribute To Risk:  No gross cognitive deficits noted upon discharge. Is alert , attentive, and oriented x 3   Suicide Risk:  Mild:  Suicidal ideation of limited frequency, intensity, duration, and specificity.  There are no identifiable plans, no associated intent, mild dysphoria and related symptoms, good self-control (both objective and subjective assessment), few other risk factors, and identifiable protective factors, including available and accessible social support.  Follow-up Information  [facility] Follow up on 11/09/2016.   Why:  Hospital follow up appointment @ 10:15am. Please bring your insurance card, photo ID, social secuirty card, and proof of  income if you have it. Thank you. Contact information: [address] [place] [address] [phone]           Plan Of Care/Follow-up recommendations:  Activity:  as tolerated  Diet:  regular Tests:  NA Other:  see below  Patient is requesting discharge and there are no current grounds for involuntary commitment  Patient is leaving unit in good spirits  She is planning on returning home- states she has asked several of the people who were living there to leave, so that she feels environment will be less stressful. Reports desire and motivation to maintain sobriety. 12 step program participation encouraged .   Craige Cotta, MD 11/05/2016, 12:08 PM

## 2016-11-05 NOTE — Tx Team (Signed)
Interdisciplinary Treatment and Diagnostic Plan Update 11/05/2016 Time of Session: 9:30am  Martha SchleinCrystal Chen  MRN: [id number]  Principal Diagnosis: MDD , Suicide Attempt   Secondary Diagnoses: Active Problems:   Major depressive disorder, recurrent episode (HCC)   Current Medications:  Current Facility-Administered Medications  Medication Dose Route Frequency Provider Last Rate Last Dose  . acetaminophen (TYLENOL) tablet 650 mg  650 mg Oral Q6H PRN Armandina StammerNwoko, Agnes I, NP   650 mg at 11/04/16 1839  . alum & mag hydroxide-simeth (MAALOX/MYLANTA) 200-200-20 MG/5ML suspension 30 mL  30 mL Oral Q4H PRN Nwoko, Agnes I, NP      . citalopram (CELEXA) tablet 20 mg  20 mg Oral Daily Cobos, Fernando A, MD   20 mg at 11/05/16 0845  . hydrOXYzine (ATARAX/VISTARIL) tablet 25 mg  25 mg Oral Q6H PRN Armandina StammerNwoko, Agnes I, NP   25 mg at 11/05/16 0846  . magnesium hydroxide (MILK OF MAGNESIA) suspension 30 mL  30 mL Oral Daily PRN Nwoko, Agnes I, NP      . naproxen (NAPROSYN) tablet 500 mg  500 mg Oral BID PRN Truman HaywardStarkes, Takia S, FNP   500 mg at 11/04/16 2157  . nicotine (NICODERM CQ - dosed in mg/24 hours) patch 21 mg  21 mg Transdermal Q0600 Armandina StammerNwoko, Agnes I, NP   21 mg at 11/05/16 0641  . traZODone (DESYREL) tablet 50 mg  50 mg Oral QHS Armandina StammerNwoko, Agnes I, NP   50 mg at 11/04/16 2157    PTA Medications: Prescriptions Prior to Admission  Medication Sig Dispense Refill Last Dose  . citalopram (CELEXA) 10 MG tablet Take 1 tablet (10 mg total) by mouth daily. 30 tablet 0 Past Week at Unknown time  . ibuprofen (ADVIL,MOTRIN) 800 MG tablet Take 800 mg by mouth every 8 (eight) hours as needed for moderate pain.   Past Week at Unknown time  . traZODone (DESYREL) 50 MG tablet Take 1 tablet (50 mg total) by mouth at bedtime as needed for sleep. (Patient taking differently: Take 50 mg by mouth at bedtime. ) 30 tablet 0 11/02/2016 at Unknown time  . hydrOXYzine (ATARAX/VISTARIL) 25 MG tablet Take 1 tablet (25 mg total) by mouth every 6  (six) hours as needed for anxiety. (Patient not taking: Reported on 11/03/2016) 30 tablet 0 Not Taking at Unknown time  . nicotine (NICODERM CQ - DOSED IN MG/24 HOURS) 21 mg/24hr patch Place 1 patch (21 mg total) onto the skin daily. (Patient not taking: Reported on 11/03/2016) 28 patch 0 Not Taking at Unknown time  . thiamine 100 MG tablet Take 1 tablet (100 mg total) by mouth daily. (Patient not taking: Reported on 11/03/2016) 30 tablet 0 Not Taking at Unknown time    Treatment Modalities: Medication Management, Group therapy, Case management,  1 to 1 session with clinician, Psychoeducation, Recreational therapy.  Patient Stressors: Financial difficulties Medication change or noncompliance Substance abuse Patient Strengths: DentistCommunication skills General fund of knowledge Motivation for treatment/growth  Physician Treatment Plan for Primary Diagnosis: MDD , Suicide Attempt  Long Term Goal(s): Improvement in symptoms so as ready for discharge Short Term Goals: Ability to verbalize feelings will improve Ability to disclose and discuss suicidal ideas Ability to demonstrate self-control will improve Ability to identify and develop effective coping behaviors will improve Ability to maintain clinical measurements within normal limits will improve Ability to identify triggers associated with substance abuse/mental health issues will improve  Medication Management: Evaluate patient's response, side effects, and tolerance of medication regimen.  Therapeutic Interventions:  1 to 1 sessions, Unit Group sessions and Medication administration.  Evaluation of Outcomes: Adequate for Discharge  Physician Treatment Plan for Secondary Diagnosis: Active Problems:   Major depressive disorder, recurrent episode (HCC)  Long Term Goal(s): Improvement in symptoms so as ready for discharge  Short Term Goals: Ability to verbalize feelings will improve Ability to disclose and discuss suicidal ideas Ability to  demonstrate self-control will improve Ability to identify and develop effective coping behaviors will improve Ability to maintain clinical measurements within normal limits will improve Ability to identify triggers associated with substance abuse/mental health issues will improve  Medication Management: Evaluate patient's response, side effects, and tolerance of medication regimen.  Therapeutic Interventions: 1 to 1 sessions, Unit Group sessions and Medication administration.  Evaluation of Outcomes: Adequate for Discharge  RN Treatment Plan for Primary Diagnosis: MDD , Suicide Attempt  Long Term Goal(s): Knowledge of disease and therapeutic regimen to maintain health will improve  Short Term Goals: Ability to remain free from injury will improve and Compliance with prescribed medications will improve  Medication Management: RN will administer medications as ordered by provider, will assess and evaluate patient's response and provide education to patient for prescribed medication. RN will report any adverse and/or side effects to prescribing provider.  Therapeutic Interventions: 1 on 1 counseling sessions, Psychoeducation, Medication administration, Evaluate responses to treatment, Monitor vital signs and CBGs as ordered, Perform/monitor CIWA, COWS, AIMS and Fall Risk screenings as ordered, Perform wound care treatments as ordered.  Evaluation of Outcomes: Adequate for Discharge  LCSW Treatment Plan for Primary Diagnosis: MDD , Suicide Attempt  Long Term Goal(s): Safe transition to appropriate next level of care at discharge, Engage patient in therapeutic group addressing interpersonal concerns. Short Term Goals: Engage patient in aftercare planning with referrals and resources, Facilitate patient progression through stages of change regarding substance use diagnoses and concerns, Identify triggers associated with mental health/substance abuse issues and Increase skills for wellness and  recovery  Therapeutic Interventions: Assess for all discharge needs, 1 to 1 time with Social worker, Explore available resources and support systems, Assess for adequacy in community support network, Educate family and significant other(s) on suicide prevention, Complete Psychosocial Assessment, Interpersonal group therapy.  Evaluation of Outcomes: Adequate for Discharge  Progress in Treatment: Attending groups: Yes Participating in groups: Yes, minimally Taking medication as prescribed: Yes, MD continues to assess for medication changes as needed Toleration medication: Yes, no side effects reported at this time Family/Significant other contact made: No, pt declined contact Patient understands diagnosis: Yes, AEB pt's willingness to participate in treatment. Discussing patient identified problems/goals with staff: Yes Medical problems stabilized or resolved: Yes Denies suicidal/homicidal ideation: Yes Issues/concerns per patient self-inventory: None Other: N/A  New problem(s) identified: None identified at this time.   New Short Term/Long Term Goal(s): None identified at this time.   Discharge Plan or Barriers: Pt will return home and follow up outpatient with [facility].  Reason for Continuation of Hospitalization:  None identified at this time.  Estimated Length of Stay: 0 days  Attendees: Patient: 11/05/2016 10:56 AM  Physician: Dr. Jama Flavors 11/05/2016 10:56 AM  Nursing: Jesusita Oka RN; Alcario Drought, RN 11/05/2016 10:56 AM  RN Care Manager: Onnie Boer, RN 11/05/2016 10:56 AM  Social Worker: Donnelly Stager, LCSWA 11/05/2016 10:56 AM  Recreational Therapist:  11/05/2016 10:56 AM  Other: Armandina Stammer, NP 11/05/2016 10:56 AM  Other:  11/05/2016 10:56 AM  Other: 11/05/2016 10:56 AM  Scribe for Treatment Team: Jonathon Jordan, MSW,LCSWA 11/05/2016 10:56 AM
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
## 2016-11-05 NOTE — Progress Notes (Signed)
Recreation Therapy Notes  Date: 11/05/16 Time: 0930 Location: [address] Dayroom  Group Topic: Stress Management  Goal Area(s) Addresses:  Patient will verbalize importance of using healthy stress management.  Patient will identify positive emotions associated with healthy stress management.   Intervention: Stress Management  Activity :  Guided Imagery.  LRT introduced the stress management technique of guided imagery.  LRT read a script to allow patients to take a mental vacation to escape their current surroundings.  Patients were to follow along as the script was read to fully engage in the activity.  Education:  Stress Management, Discharge Planning.   Education Outcome: Acknowledges edcuation/In group clarification offered/Needs additional education  Clinical Observations/Feedback: Pt did not attend group.   Caroll RancherMarjette Dent Plantz, LRT/CTRS         Caroll RancherLindsay, Sera Hitsman A 11/05/2016 11:24 AM

## 2016-11-05 NOTE — Progress Notes (Signed)
D   Pt attended karaoke group and reports enjoying same   She said she believes the doctor will discharge her tomorrow and she feels ready to go even though her plans are not firm in place yet  A   Verbal support given   Medications administered and effectiveness monitored   Q 15 min checks    R   Pt is safe at present time

## 2016-12-12 DIAGNOSIS — E663 Overweight: Secondary | ICD-10-CM | POA: Diagnosis not present

## 2016-12-12 DIAGNOSIS — I214 Non-ST elevation (NSTEMI) myocardial infarction: Secondary | ICD-10-CM

## 2016-12-12 DIAGNOSIS — F1410 Cocaine abuse, uncomplicated: Secondary | ICD-10-CM | POA: Diagnosis not present

## 2016-12-12 DIAGNOSIS — F14988 Cocaine use, unspecified with other cocaine-induced disorder: Secondary | ICD-10-CM | POA: Diagnosis not present

## 2016-12-12 DIAGNOSIS — Z720 Tobacco use: Secondary | ICD-10-CM

## 2016-12-12 DIAGNOSIS — M4854XA Collapsed vertebra, not elsewhere classified, thoracic region, initial encounter for fracture: Secondary | ICD-10-CM | POA: Diagnosis not present

## 2016-12-12 DIAGNOSIS — R079 Chest pain, unspecified: Secondary | ICD-10-CM | POA: Diagnosis not present

## 2016-12-13 DIAGNOSIS — E663 Overweight: Secondary | ICD-10-CM | POA: Diagnosis not present

## 2016-12-13 DIAGNOSIS — R079 Chest pain, unspecified: Secondary | ICD-10-CM

## 2016-12-13 DIAGNOSIS — F14988 Cocaine use, unspecified with other cocaine-induced disorder: Secondary | ICD-10-CM | POA: Diagnosis not present

## 2016-12-13 DIAGNOSIS — F1410 Cocaine abuse, uncomplicated: Secondary | ICD-10-CM | POA: Diagnosis not present

## 2016-12-13 DIAGNOSIS — I214 Non-ST elevation (NSTEMI) myocardial infarction: Secondary | ICD-10-CM | POA: Diagnosis not present

## 2016-12-13 DIAGNOSIS — F1910 Other psychoactive substance abuse, uncomplicated: Secondary | ICD-10-CM

## 2017-03-23 ENCOUNTER — Emergency Department (HOSPITAL_COMMUNITY)
Admission: EM | Admit: 2017-03-23 | Discharge: 2017-03-23 | Disposition: A | Discharge status: Home / Self Care | Payer: Medicaid Other | Attending: Emergency Medicine | Admitting: Emergency Medicine

## 2017-03-23 ENCOUNTER — Emergency Department (HOSPITAL_COMMUNITY): Payer: Medicaid Other

## 2017-03-23 ENCOUNTER — Encounter (HOSPITAL_COMMUNITY): Payer: Self-pay | Admitting: Emergency Medicine

## 2017-03-23 DIAGNOSIS — M7918 Myalgia, other site: Secondary | ICD-10-CM | POA: Diagnosis not present

## 2017-03-23 DIAGNOSIS — M545 Low back pain: Secondary | ICD-10-CM | POA: Diagnosis present

## 2017-03-23 DIAGNOSIS — M79672 Pain in left foot: Secondary | ICD-10-CM | POA: Insufficient documentation

## 2017-03-23 DIAGNOSIS — M79671 Pain in right foot: Secondary | ICD-10-CM | POA: Insufficient documentation

## 2017-03-23 DIAGNOSIS — Z79899 Other long term (current) drug therapy: Secondary | ICD-10-CM | POA: Diagnosis not present

## 2017-03-23 DIAGNOSIS — F17210 Nicotine dependence, cigarettes, uncomplicated: Secondary | ICD-10-CM | POA: Insufficient documentation

## 2017-03-23 LAB — I-STAT BETA HCG BLOOD, ED (MC, WL, AP ONLY)

## 2017-03-23 MED ORDER — ACETAMINOPHEN 325 MG PO TABS
650.0000 mg | ORAL_TABLET | Freq: Once | ORAL | Status: AC
  Administered 2017-03-23: 650 mg via ORAL
  Filled 2017-03-23: qty 2

## 2017-03-23 NOTE — ED Notes (Signed)
Patient transported to X-ray 
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
## 2017-03-23 NOTE — Discharge Instructions (Signed)
Take up to 800mg  of ibuprofen (that is usually 4 over the counter pills)  3 times a day for 5 days. Take with food.  Please follow with your primary care doctor in the next 2 days for a check-up. They must obtain records for further management.   Do not hesitate to return to the Emergency Department for any new, worsening or concerning symptoms.

## 2017-04-15 ENCOUNTER — Encounter (HOSPITAL_COMMUNITY): Payer: Self-pay

## 2017-04-15 ENCOUNTER — Inpatient Hospital Stay (HOSPITAL_COMMUNITY)
Admission: AD | Admit: 2017-04-15 | Discharge: 2017-04-21 | DRG: 885 | Disposition: A | Discharge status: Home / Self Care | Payer: Medicaid Other | Source: Other Acute Inpatient Hospital | Attending: Psychiatry | Admitting: Psychiatry

## 2017-04-15 ENCOUNTER — Other Ambulatory Visit: Payer: Self-pay

## 2017-04-15 DIAGNOSIS — F17210 Nicotine dependence, cigarettes, uncomplicated: Secondary | ICD-10-CM | POA: Diagnosis present

## 2017-04-15 DIAGNOSIS — F1510 Other stimulant abuse, uncomplicated: Secondary | ICD-10-CM | POA: Diagnosis present

## 2017-04-15 DIAGNOSIS — R569 Unspecified convulsions: Secondary | ICD-10-CM | POA: Diagnosis not present

## 2017-04-15 DIAGNOSIS — F332 Major depressive disorder, recurrent severe without psychotic features: Principal | ICD-10-CM | POA: Diagnosis present

## 2017-04-15 DIAGNOSIS — F1524 Other stimulant dependence with stimulant-induced mood disorder: Secondary | ICD-10-CM

## 2017-04-15 DIAGNOSIS — F1910 Other psychoactive substance abuse, uncomplicated: Secondary | ICD-10-CM | POA: Diagnosis not present

## 2017-04-15 DIAGNOSIS — F329 Major depressive disorder, single episode, unspecified: Secondary | ICD-10-CM | POA: Insufficient documentation

## 2017-04-15 DIAGNOSIS — F419 Anxiety disorder, unspecified: Secondary | ICD-10-CM | POA: Diagnosis present

## 2017-04-15 DIAGNOSIS — Z79899 Other long term (current) drug therapy: Secondary | ICD-10-CM

## 2017-04-15 DIAGNOSIS — F1110 Opioid abuse, uncomplicated: Secondary | ICD-10-CM | POA: Diagnosis not present

## 2017-04-15 DIAGNOSIS — F19959 Other psychoactive substance use, unspecified with psychoactive substance-induced psychotic disorder, unspecified: Secondary | ICD-10-CM | POA: Diagnosis present

## 2017-04-15 DIAGNOSIS — R450 Nervousness: Secondary | ICD-10-CM | POA: Diagnosis not present

## 2017-04-15 DIAGNOSIS — G4700 Insomnia, unspecified: Secondary | ICD-10-CM | POA: Diagnosis present

## 2017-04-15 DIAGNOSIS — F1310 Sedative, hypnotic or anxiolytic abuse, uncomplicated: Secondary | ICD-10-CM | POA: Diagnosis not present

## 2017-04-15 MED ORDER — CEPHALEXIN 500 MG PO CAPS
500.0000 mg | ORAL_CAPSULE | Freq: Three times a day (TID) | ORAL | Status: AC
Start: 2017-04-15 — End: 2017-04-17
  Administered 2017-04-15 – 2017-04-17 (×6): 500 mg via ORAL
  Filled 2017-04-15: qty 2
  Filled 2017-04-15 (×2): qty 1
  Filled 2017-04-15 (×3): qty 2
  Filled 2017-04-15 (×4): qty 1

## 2017-04-15 MED ORDER — CITALOPRAM HYDROBROMIDE 20 MG PO TABS
20.0000 mg | ORAL_TABLET | Freq: Every day | ORAL | Status: DC
  Administered 2017-04-16 – 2017-04-21 (×6): 20 mg via ORAL
  Filled 2017-04-15 (×8): qty 1

## 2017-04-15 MED ORDER — ONDANSETRON 4 MG PO TBDP: 4.0000 mg | ORAL_TABLET | Freq: Four times a day (QID) | ORAL | Status: AC | PRN

## 2017-04-15 MED ORDER — LOPERAMIDE HCL 2 MG PO CAPS: 2.0000 mg | ORAL_CAPSULE | ORAL | Status: AC | PRN

## 2017-04-15 MED ORDER — HYDROXYZINE HCL 25 MG PO TABS
25.0000 mg | ORAL_TABLET | Freq: Four times a day (QID) | ORAL | Status: DC | PRN
  Administered 2017-04-16 – 2017-04-20 (×4): 25 mg via ORAL
  Filled 2017-04-15 (×2): qty 1

## 2017-04-15 MED ORDER — ACETAMINOPHEN 325 MG PO TABS
650.0000 mg | ORAL_TABLET | Freq: Four times a day (QID) | ORAL | Status: DC | PRN
  Administered 2017-04-15 – 2017-04-20 (×6): 650 mg via ORAL
  Filled 2017-04-15 (×6): qty 2

## 2017-04-15 MED ORDER — NICOTINE 21 MG/24HR TD PT24
21.0000 mg | MEDICATED_PATCH | Freq: Every day | TRANSDERMAL | Status: DC
  Administered 2017-04-16 – 2017-04-21 (×6): 21 mg via TRANSDERMAL
  Filled 2017-04-15 (×9): qty 1

## 2017-04-15 MED ORDER — NAPROXEN 500 MG PO TABS
500.0000 mg | ORAL_TABLET | Freq: Two times a day (BID) | ORAL | Status: AC | PRN
  Administered 2017-04-16 – 2017-04-20 (×9): 500 mg via ORAL
  Filled 2017-04-15 (×9): qty 1

## 2017-04-15 MED ORDER — ALUM & MAG HYDROXIDE-SIMETH 200-200-20 MG/5ML PO SUSP: 30.0000 mL | ORAL | Status: DC | PRN

## 2017-04-15 MED ORDER — DICYCLOMINE HCL 20 MG PO TABS: 20.0000 mg | ORAL_TABLET | Freq: Four times a day (QID) | ORAL | Status: AC | PRN

## 2017-04-15 MED ORDER — METHOCARBAMOL 500 MG PO TABS
500.0000 mg | ORAL_TABLET | Freq: Three times a day (TID) | ORAL | Status: AC | PRN
  Administered 2017-04-16 – 2017-04-20 (×5): 500 mg via ORAL
  Filled 2017-04-15 (×5): qty 1

## 2017-04-15 MED ORDER — INFLUENZA VAC SPLIT QUAD 0.5 ML IM SUSY
0.5000 mL | PREFILLED_SYRINGE | INTRAMUSCULAR | Status: DC
Start: 2017-04-16 — End: 2017-04-21
  Filled 2017-04-15: qty 0.5

## 2017-04-15 MED ORDER — MAGNESIUM HYDROXIDE 400 MG/5ML PO SUSP: 30.0000 mL | Freq: Every day | ORAL | Status: DC | PRN

## 2017-04-15 MED ORDER — TRAZODONE HCL 50 MG PO TABS
50.0000 mg | ORAL_TABLET | Freq: Every day | ORAL | Status: DC
  Administered 2017-04-15 – 2017-04-20 (×5): 50 mg via ORAL
  Filled 2017-04-15 (×10): qty 1
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
## 2017-04-15 NOTE — Tx Team (Signed)
Initial Treatment Plan 04/15/2017 9:07 PM Martha Chen [id number]    PATIENT STRESSORS: Financial difficulties Health problems Substance abuse Other: Chronic pain   PATIENT STRENGTHS: Capable of independent living Communication skills General fund of knowledge Motivation for treatment/growth   PATIENT IDENTIFIED PROBLEMS: Depression  Substance abuse  Suicidal ideation  "Get clean"  "Get my head together"             DISCHARGE CRITERIA:  Medical problems require only outpatient monitoring Verbal commitment to aftercare and medication compliance Withdrawal symptoms are absent or subacute and managed without 24-hour nursing intervention  PRELIMINARY DISCHARGE PLAN: Outpatient therapy Medication management  PATIENT/FAMILY INVOLVEMENT: This treatment plan has been presented to and reviewed with the patient, Martha Chen.  The patient and family have been given the opportunity to ask questions and make suggestions.  Levin BaconHeather V Jaxan Michel, RN 04/15/2017, 9:07 PM

## 2017-04-16 ENCOUNTER — Encounter (HOSPITAL_COMMUNITY): Payer: Self-pay | Admitting: Registered Nurse

## 2017-04-16 DIAGNOSIS — R450 Nervousness: Secondary | ICD-10-CM

## 2017-04-16 DIAGNOSIS — F17210 Nicotine dependence, cigarettes, uncomplicated: Secondary | ICD-10-CM

## 2017-04-16 DIAGNOSIS — F1524 Other stimulant dependence with stimulant-induced mood disorder: Secondary | ICD-10-CM

## 2017-04-16 DIAGNOSIS — F419 Anxiety disorder, unspecified: Secondary | ICD-10-CM

## 2017-04-16 DIAGNOSIS — F332 Major depressive disorder, recurrent severe without psychotic features: Principal | ICD-10-CM

## 2017-04-16 DIAGNOSIS — F1910 Other psychoactive substance abuse, uncomplicated: Secondary | ICD-10-CM

## 2017-04-16 NOTE — H&P (Signed)
Psychiatric Admission Assessment Adult  Patient Identification: Martha Chen MRN:  [id number] Date of Evaluation:  04/16/2017 Chief Complaint:  MDD Polysubstance use disorder  Principal Diagnosis: MDD (major depressive disorder), recurrent severe, without psychosis ([place]) Diagnosis:   Patient Active Problem List   Diagnosis Date Noted  . MDD (major depressive disorder), recurrent severe, without psychosis ([place]) [F33.2] 04/16/2017  . Polysubstance abuse ([place]) [F19.10] 04/16/2017  . Other stimulant dependence with stimulant-induced mood disorder ([place]) [F15.24]   . MDD (major depressive disorder) [F32.9] 04/15/2017  . Major depressive disorder, recurrent episode ([place]) [F33.9] 11/02/2016  . Drug overdose [T50.901A] 05/22/2016  . Major depression, recurrent ([place]) [F33.9] 05/21/2016  . Seizures ([place]) [R56.9]   . Headache [R51] 04/29/2014  . Seizure ([place]) [R56.9] 04/17/2014  . Smoking [F17.200] 04/17/2014  . Nausea and vomiting [R11.2] 04/17/2014  . Metabolic acidosis [L57.2] 04/17/2014  . Leukocytosis [D72.829] 04/17/2014  . Hypokalemia [E87.6] 04/17/2014   History of Present Illness: Martha Chen, 41 y.o., female patient seen face to face by this provider on 04/16/17.  Chart reviewed and discussed with treatment team.  On evaluation Martha Chen reports "I took Chen hand full of Benadryl.  I had been up for 7-8 days on meth binge.  I just needed some sleep.  I told my daughter that I needed help.  Patient states that she has Chen chronic history of depression and polysubstance abuse.  At this time patient denies suicidal/homicidal ideation, psychosis, and paranoia.    Associated Signs/Symptoms: Depression Symptoms:  depressed mood, insomnia, difficulty concentrating, suicidal attempt, (Hypo) Manic Symptoms:  Impulsivity, Irritable Mood, Anxiety Symptoms:  Excessive Worry, Psychotic Symptoms:  Denies PTSD Symptoms: denies Total Time spent with patient: 45 minutes  Past Psychiatric  History: Prior psychiatric hospitalization for Major Depression and Polysubstance abuse.  PCP prescribing Celexa  Is the patient at risk to self? Yes.    Has the patient been Chen risk to self in the past 6 months? Yes.    Has the patient been Chen risk to self within the distant past? Yes.    Is the patient Chen risk to others? No.  Has the patient been Chen risk to others in the past 6 months? No.  Has the patient been Chen risk to others within the distant past? No.   Prior Inpatient Therapy: Prior Inpatient Therapy: Yes Prior Therapy Dates: 04/2016; 10/2016 Prior Therapy Facilty/Provider(s): [facility] Reason for Treatment: SI Prior Outpatient Therapy: Prior Outpatient Therapy: Yes Prior Therapy Facilty/Provider(s): [facility] Does patient have an ACCT team?: No Does patient have Intensive In-House Services?  : No Does patient have [facility] services? : No Does patient have P4CC services?: No  Alcohol Screening: 1. How often do you have Chen drink containing alcohol?: Monthly or less 2. How many drinks containing alcohol do you have on Chen typical day when you are drinking?: 1 or 2 3. How often do you have six or more drinks on one occasion?: Never AUDIT-C Score: 1 9. Have you or someone else been injured as Chen result of your drinking?: No 10. Has Chen relative or friend or Chen doctor or another health worker been concerned about your drinking or suggested you cut down?: No Alcohol Use Disorder Identification Test Final Score (AUDIT): 1 Intervention/Follow-up: AUDIT Score <7 follow-up not indicated Substance Abuse History in the last 12 months:  Yes.   Consequences of Substance Abuse: Legal Consequences:  Court, jail time Family Consequences:  Family discord Previous Psychotropic Medications: Yes  Psychological Evaluations: No  Past Medical  History:  Past Medical History:  Diagnosis Date  . Headache 04/29/2014  . Seizures ([place])     Past Surgical History:  Procedure Laterality Date  . BACK SURGERY    .  CHOLECYSTECTOMY    . TUBAL LIGATION     Family History:  Family History  Problem Relation Age of Onset  . Liver disease Mother   . Sarcoidosis Mother   . Emphysema Father   . Seizures Neg Hx    Family Psychiatric  History: denies  Tobacco Screening: Have you used any form of tobacco in the last 30 days? (Cigarettes, Smokeless Tobacco, Cigars, and/or Pipes): Yes Tobacco use, Select all that apply: 5 or more cigarettes per day Are you interested in Tobacco Cessation Medications?: Yes, will notify MD for an order Counseled patient on smoking cessation including recognizing danger situations, developing coping skills and basic information about quitting provided: Refused/Declined practical counseling Social History:  Social History   Substance and Sexual Activity  Alcohol Use No  . Alcohol/week: 0.0 oz     Social History   Substance and Sexual Activity  Drug Use Yes  . Types: Oxycodone, Benzodiazepines   Comment: oxy 59m daily and xanax 274mdaily    Additional Social History: Marital status: Single    Pain Medications: denies Prescriptions: denies Over the Counter: buying pain pills off the street History of alcohol / drug use?: Yes Name of Substance 1: Pt tests positive for opiates, oxycodone, amphetamines, methamphetamines, benzos, and cocaine 1 - Age of First Use: Pt reports using pain pills for @ 2 years, cocaine for @ one year, and meth for Chen few months 1 - Frequency: "off and on" 1 - Duration: ongoing                  Allergies:  No Known Allergies Lab Results: No results found for this or any previous visit (from the past 4865our(s)).  Blood Alcohol level:  Lab Results  Component Value Date   [facility]11 [id number]/[id number]  Metabolic Disorder Labs:  No results found for: HGBA1C, MPG No results found for: PROLACTIN No results found for: CHOL, TRIG, HDL, CHOLHDL, VLDL, LDLCALC  Current Medications: Current Facility-Administered Medications  Medication Dose  Route Frequency Provider Last Rate Last Dose  . acetaminophen (TYLENOL) tablet 650 mg  650 mg Oral Q6H PRN Martha Chen, Martha Chen, Martha Chen   650 mg at 04/16/17 1441  . alum & mag hydroxide-simeth (MAALOX/MYLANTA) 200-200-20 MG/5ML suspension 30 mL  30 mL Oral Q4H PRN Martha Chen, Martha Chen, Martha Chen      . cephALEXin (KEFLEX) capsule 500 mg  500 mg Oral Q8H Martha Chen, Martha Chen, Martha Chen   500 mg at 04/16/17 1441  . citalopram (CELEXA) tablet 20 mg  20 mg Oral Daily Martha Chen, Martha Chen, Martha Chen   20 mg at 04/16/17 0813  . dicyclomine (BENTYL) tablet 20 mg  20 mg Oral Q6H PRN BeLindon Romp, Martha Chen      . hydrOXYzine (ATARAX/VISTARIL) tablet 25 mg  25 mg Oral Q6H PRN Martha Chen, Martha Chen, Martha Chen   25 mg at 04/16/17 1442  . Influenza vac split quadrivalent PF (FLUARIX) injection 0.5 mL  0.5 mL Intramuscular Tomorrow-1000 Martha Chen, Martha A, MD      . loperamide (IMODIUM) capsule 2-4 mg  2-4 mg Oral PRN BeLindon Romp, Martha Chen      . magnesium hydroxide (MILK OF MAGNESIA) suspension 30 mL  30 mL Oral Daily PRN Martha Chen, Martha Chen, Martha Chen      .  methocarbamol (ROBAXIN) tablet 500 mg  500 mg Oral Q8H PRN Martha Romp Chen, Martha Chen      . naproxen (NAPROSYN) tablet 500 mg  500 mg Oral BID PRN Martha Romp Chen, Martha Chen   500 mg at 04/16/17 1137  . nicotine (NICODERM CQ - dosed in mg/24 hours) patch 21 mg  21 mg Transdermal Daily Martha Chen, Martha Peer, MD   21 mg at 04/16/17 0813  . ondansetron (ZOFRAN-ODT) disintegrating tablet 4 mg  4 mg Oral Q6H PRN Martha Nunnery, Martha Chen      . traZODone (DESYREL) tablet 50 mg  50 mg Oral QHS Martha Chen, Martha Chen, Martha Chen   50 mg at 04/15/17 2139   PTA Medications: Medications Prior to Admission  Medication Sig Dispense Refill Last Dose  . hydrOXYzine (ATARAX/VISTARIL) 25 MG tablet Take 1 tablet (25 mg total) by mouth every 6 (six) hours as needed for anxiety. 60 tablet 0 03/22/2017 at prn  . nicotine (NICODERM CQ - DOSED IN MG/24 HOURS) 21 mg/24hr patch Place 1 patch (21 mg total) onto the skin daily at 6 (six) AM. For smoking cessation 28 patch 0 Not Taking at  Unknown time  . citalopram (CELEXA) 20 MG tablet Take 1 tablet (20 mg total) by mouth daily. For depression 30 tablet 0 Past Week at Unknown time  . traZODone (DESYREL) 50 MG tablet Take 1 tablet (50 mg total) by mouth at bedtime. For sleep 30 tablet 0 03/22/2017 at Unknown time    Musculoskeletal: Strength & Muscle Tone: within normal limits Gait & Station: normal Patient leans: N/Chen  Psychiatric Specialty Exam: Physical Exam  Nursing note and vitals reviewed. Constitutional: She is oriented to person, place, and time.  Neck: Normal range of motion.  Respiratory: Effort normal.  Neurological: She is alert and oriented to person, place, and time.  Skin: Skin is warm and dry.    Review of Systems  Psychiatric/Behavioral: Positive for depression, substance abuse and suicidal ideas. Negative for hallucinations. The patient is nervous/anxious. The patient does not have insomnia.   All other systems reviewed and are negative.   Blood pressure 128/85, pulse 87, temperature 98.1 F (36.7 C), temperature source Oral, resp. rate 18, height 5' 4"  (1.626 m), weight 77.1 kg (170 lb).Body mass index is 29.18 kg/m.  General Appearance: Casual and Fairly Groomed  Chen Contact:  Good  Speech:  Clear and Coherent and Normal Rate  Volume:  Normal  Mood:  Anxious and Depressed  Affect:  Depressed  Thought Process:  Goal Directed  Orientation:  Full (Time, Place, and Person)  Thought Content:  Denies hallucinations, delusions, and paranoia  Suicidal Thoughts:  Overdose of Benadryl.  Denies at this time  Homicidal Thoughts:  No  Memory:  Immediate;   Good Recent;   Good Remote;   Good  Judgement:  Fair  Insight:  Fair  Psychomotor Activity:  Normal  Concentration:  Concentration: Good and Attention Span: Fair  Recall:  Good  Fund of Knowledge:  Fair  Language:  Good  Akathisia:  No  Handed:  Right  AIMS (if indicated):     Assets:  Communication Skills Desire for  Improvement Housing Social Support  ADL's:  Intact  Cognition:  WNL  Sleep:       Treatment Plan Summary: Daily contact with patient to assess and evaluate symptoms and progress in treatment and Medication management  Observation Level/Precautions:  15 minute checks  Laboratory:  CBC Chemistry Profile UDS UA  Psychotherapy:  Individual and group  sessions  Medications:   Continue Celexa 20 mg daily for Major Depression Started Vistaril 25 mg Q 6 hr prn  For Anxiety Started Trazodone 50 mg Q hs prn for insomnia  Consultations:  Social work and psychiatry  Discharge Concerns:  Safety, stabilization, and access to medication  Estimated LOS:  5-7 days  Other:     Physician Treatment Plan for Primary Diagnosis: MDD (major depressive disorder), recurrent severe, without psychosis ([place]) Long Term Goal(s): Improvement in symptoms so as ready for discharge  Short Term Goals: Ability to identify changes in lifestyle to reduce recurrence of condition will improve, Ability to verbalize feelings will improve, Ability to disclose and discuss suicidal ideas, Ability to demonstrate self-control will improve and Ability to identify and develop effective coping behaviors will improve  Physician Treatment Plan for Secondary Diagnosis: Principal Problem:   MDD (major depressive disorder), recurrent severe, without psychosis ([place]) Active Problems:   Other stimulant dependence with stimulant-induced mood disorder ([place])   Polysubstance abuse ([place])  Long Term Goal(s): Improvement in symptoms so as ready for discharge  Short Term Goals: Ability to identify and develop effective coping behaviors will improve, Ability to maintain clinical measurements within normal limits will improve, Compliance with prescribed medications will improve and Ability to identify triggers associated with substance abuse/mental health issues will improve  I certify that inpatient services furnished can reasonably Martha Chen  expected to improve the patient's condition.    Martha Chen, [place], Martha Chen 11/17/20185:16 PM   I have discussed case with Martha Chen and have met with patient  Agree with Martha Chen note and assessment  41 year old female, known to our unit from prior admissions, most recently in June 2018. At the time presented with depression, related to significant psychosocial stressors. She was diagnosed with MDD, discharged on Celexa.  She was admitted under commitment generated by her daughter, reporting she had been hallucinating and making suicidal statements . Patient states " I was doing OK up to about Chen month ago" . States that she " started to associate with certain people and got into methamphetamine". Acknowledges that while using methamphetamine she was hearing voices and " feeling crazy". Reports recent overdose on Benadryl, but states intent was not suicidal but to " get some sleep". At this time states she feels better, and denies any suicidal ideations today. States she likes Celexa and feels it works, had stopped taking it about one month ago. At this time denies Suicidal ideations.  Dx- Substance Induced Mood Disorder, Substance Induced Psychosis, Methamphetamine Abuse   Plan- Inpatient admission. Has been restarted on Celexa 20 mgrs QDAY, which she reports she was doing well on , without side effects.

## 2017-04-16 NOTE — [facility] Group Notes (Signed)
[facility] Group Notes:  (Nursing)  Date:  04/16/2017  Time:  1030 Type of Therapy:  Nurse Education  Participation Level:  Minimal  Participation Quality:  Attentive  Affect:  Appropriate  Cognitive:  Alert and Appropriate  Insight:  Appropriate  Engagement in Group:  Engaged  Modes of Intervention:  Discussion  Summary of Progress/Problems: Discussed goals and Healthy Coping Skills Workbook Shela NevinValerie S Fallyn Munnerlyn 04/16/2017, 12:37 PM

## 2017-04-16 NOTE — Progress Notes (Signed)
D. Pt presents with an anxious affect and behavior. Pt observed interacting in milieu with peers, and attending am nursing group. Pt completed self inventory, rating her depression and hopelessness , both '0', and her anxiety a 1/10.Pt given prn medication for back pain- 5/10. Pt currently denies SI/HI and AVH. Pt reports that her goal for today is "to be positive" and to "try my best". A. Labs and vitals monitored. Pt compliant with medications. Pt supported emotionally and encouraged to express concerns and ask questions.   R. Pt remains safe with 15 minute checks. Will continue POC.

## 2017-04-16 NOTE — [facility] Group Notes (Signed)
LCSW Group Therapy Note  Date/Time:  04/16/2017   10:00AM-11:00AM  Type of Therapy and Topic:  Group Therapy:  Fears and Unhealthy/Healthy Coping Skills  Participation Level:  Active   Description of Group:  The focus of this group was to discuss some of the prevalent fears that patients experience, and to identify the commonalities among group members.  An exercise was used to initiate the discussion, followed by writing on the white board a group-generated list of unhealthy coping and healthy coping techniques to deal with each fear.    Therapeutic Goals: 1. Patient will be able to distinguish between healthy and unhealthy coping skills 2. Patient will identify and describe 3 fears they experience 3. Patient will identify one positive coping strategy for each fear they experience 4. Patient will respond empathetically to peers' statements regarding fears they experience  Summary of Patient Progress:  The patient expressed that she has a fear of being alone.  She said her husband died 2 years ago yesterday, which is why her children thought she needed to be hospitalized, and added that because of her fear of being alone, she jumped right into another relationship and her boyfriend is "driving me nuts."  She had side conversations at times, had to be redirected.  Therapeutic Modalities Cognitive Behavioral Therapy Motivational Interviewing  Ambrose MantleMareida Grossman-Orr, LCSW

## 2017-04-16 NOTE — Plan of Care (Signed)
  Progressing Safety: Periods of time without injury will increase 04/16/2017 2310 - Progressing by Arrie Aranhurch, Virgen Belland J, RN Note Pt has not harmed self or others tonight.  She denies SI/HI and verbally contracts for safety.

## 2017-04-16 NOTE — Progress Notes (Signed)
D: Pt was in the dayroom upon initial approach.  Pt presents with apprpriate affect and mood.  Her goal today was to "be a better person and I earned it."  She reports the best part of her day was "going outside."  Pt denies SI/HI, denies hallucinations, reports back and head pain of 4/10.  She denies withdrawal symptoms.  Pt has been visible in milieu interacting with peers and staff appropriately.  Pt attended evening group.    A: Introduced self to pt.  Actively listened to pt and offered support and encouragement. Medications administered per order.  PRN medication administered for pain and muscle spasms.  Heat packs provided for pain.  Q15 minute safety checks maintained.  R: Pt is safe on the unit.  Pt is compliant with medications.  Pt verbally contracts for safety.  Will continue to monitor and assess.

## 2017-04-16 NOTE — Progress Notes (Signed)
The patient verbalized in group that she had a "pretty good" day. She went on to say that she didn't have a headache today and that she had a good visit as well. Her goal for tomorrow is to be "more positive".

## 2017-04-16 NOTE — [facility] Suicide Risk Assessment (Signed)
[facility]   Nursing information obtained from:  Patient Demographic factors:  Caucasian, Unemployed Current Mental Status:  NA Loss Factors:  Decline in physical health, Financial problems / change in socioeconomic status Historical Factors:  Family history of mental illness or substance abuse Risk Reduction Factors:  Responsible for children under 40 years of age  Total Time spent with patient: 45 minutes Principal Problem: Substance Induced Depression, Substance Induced Psychosis, Amphetamine Use Disorder  Diagnosis:   Patient Active Problem List   Diagnosis Date Noted  . MDD (major depressive disorder) [F32.9] 04/15/2017  . Major depressive disorder, recurrent episode ([facility]) [F33.9] 11/02/2016  . Drug overdose [T50.901A] 05/22/2016  . Major depression, recurrent ([facility]) [F33.9] 05/21/2016  . Seizures ([facility]) [R56.9]   . Headache [R51] 04/29/2014  . Seizure ([facility]) [R56.9] 04/17/2014  . Smoking [F17.200] 04/17/2014  . Nausea and vomiting [R11.2] 04/17/2014  . Metabolic acidosis [E87.2] 04/17/2014  . Leukocytosis [D72.829] 04/17/2014  . Hypokalemia [E87.6] 04/17/2014    Continued Clinical Symptoms:  Alcohol Use Disorder Identification Test Final Score (AUDIT): 1 The "Alcohol Use Disorders Identification Test", Guidelines for Use in Primary Care, Second Edition.  World Science writerHealth [employer] [facility](WHO). Score between 0-7:  no or low risk or alcohol related problems. Score between 8-15:  moderate risk of alcohol related problems. Score between 16-19:  high risk of alcohol related problems. Score 20 or above:  warrants further diagnostic evaluation for alcohol dependence and treatment.   CLINICAL FACTORS:  41 year old female, known to our unit from prior admissions, most recently in June 2018. At the time presented with depression, related to significant psychosocial stressors. She was diagnosed with MDD, discharged on Celexa.  She was admitted under commitment  generated by her daughter, reporting she had been hallucinating and making suicidal statements . Patient states " I was doing OK up to about a month ago" . States that she " started to associate with certain people and got into methamphetamine". Acknowledges that while using methamphetamine she was hearing voices and " feeling crazy". Reports recent overdose on Benadryl, but states intent was not suicidal but to " get some sleep". At this time states she feels better, and denies any suicidal ideations today. States she likes Celexa and feels it works, had stopped taking it about one month ago. At this time denies Suicidal ideations.  Dx- Substance Induced Mood Disorder, Substance Induced Psychosis, Methamphetamine Abuse   Plan- Inpatient admission. Has been restarted on Celexa 20 mgrs QDAY, which she reports she was doing well on , without side effects.     Musculoskeletal: Strength & Muscle Tone: within normal limits Gait & Station: normal Patient leans: N/A  Psychiatric Specialty Exam: Physical Exam  ROS (+) headache, no chest pain, no dyspnea, no vomiting   Blood pressure 128/85, pulse 87, temperature 98.1 F (36.7 C), temperature source Oral, resp. rate 18, height 5\' 4"  (1.626 m), weight 77.1 kg (170 lb).Body mass index is 29.18 kg/m.  General Appearance: Fairly Groomed  Eye Contact:  Good  Speech:  Normal Rate  Volume:  Normal  Mood:  reports she is feeling better than prior to admission  Affect:  Appropriate and reactive, vaguely depressed   Thought Process:  Linear and Descriptions of Associations: Intact  Orientation:  Full (Time, Place, and Person)  Thought Content:  no hallucinations, no delusions   Suicidal Thoughts:  No denies suicidal or homicidal ideations, contracts for safety on unit at this time  Homicidal Thoughts:  No  Memory:  recent and remote grossly intact   Judgement:  Fair  Insight:  Fair  Psychomotor Activity:  Normal  Concentration:  Concentration: Good  and Attention Span: Good  Recall:  Good  Fund of Knowledge:  Good  Language:  Good  Akathisia:  Negative  Handed:  Right  AIMS (if indicated):     Assets:  Communication Skills Desire for Improvement Resilience  ADL's:  Intact  Cognition:  WNL  Sleep:         COGNITIVE FEATURES THAT CONTRIBUTE TO RISK:  Closed-mindedness and Loss of executive function    SUICIDE RISK:   Moderate:  Frequent suicidal ideation with limited intensity, and duration, some specificity in terms of plans, no associated intent, good self-control, limited dysphoria/symptomatology, some risk factors present, and identifiable protective factors, including available and accessible social support.  PLAN OF CARE: Patient will be admitted to inpatient psychiatric unit for stabilization and safety. Will provide and encourage milieu participation. Provide medication management and maked adjustments as needed.  Will follow daily.    I certify that inpatient services furnished can reasonably be expected to improve the patient's condition.   Craige CottaFernando A Cobos, MD 04/16/2017, 2:07 PM
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
## 2017-04-17 DIAGNOSIS — F1110 Opioid abuse, uncomplicated: Secondary | ICD-10-CM

## 2017-04-17 DIAGNOSIS — F1310 Sedative, hypnotic or anxiolytic abuse, uncomplicated: Secondary | ICD-10-CM

## 2017-04-17 NOTE — Progress Notes (Signed)
[facility] MD Progress Note  04/17/2017 12:23 PM Martha Chen  MRN:  [id number] Subjective:  States she is feeling better. Denies suicidal ideations. Denies psychotic symptoms. Denies medication side effects at this time . Objective : I have reviewed chart and have met with patient. 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 States she is feeling better and closer to her normal. Currently does not endorse psychotic symptoms or paranoia, and does not exhibit any overt psychotic symptoms. Denies medication side effects.  Behavior on unit in good control, pleasant on approach, visible in milieu. Denies suicidal ideations.  Principal Problem: MDD (major depressive disorder), recurrent severe, without psychosis ([place]) Diagnosis:   Patient Active Problem List   Diagnosis Date Noted  . MDD (major depressive disorder), recurrent severe, without psychosis ([place]) [F33.2] 04/16/2017  . Polysubstance abuse ([place]) [F19.10] 04/16/2017  . Other stimulant dependence with stimulant-induced mood disorder ([place]) [F15.24]   . MDD (major depressive disorder) [F32.9] 04/15/2017  . Major depressive disorder, recurrent episode ([place]) [F33.9] 11/02/2016  . Drug overdose [T50.901A] 05/22/2016  . Major depression, recurrent ([place]) [F33.9] 05/21/2016  . Seizures ([place]) [R56.9]   . Headache [R51] 04/29/2014  . Seizure ([place]) [R56.9] 04/17/2014  . Smoking [F17.200] 04/17/2014  . Nausea and vomiting [R11.2] 04/17/2014  . Metabolic acidosis [S82.7] 04/17/2014  . Leukocytosis [D72.829] 04/17/2014  . Hypokalemia [E87.6] 04/17/2014   Total Time spent with patient: 20 minutes Past Medical History:  Past Medical History:  Diagnosis Date  . Headache 04/29/2014  . Seizures ([place])     Past Surgical  History:  Procedure Laterality Date  . BACK SURGERY    . CHOLECYSTECTOMY    . TUBAL LIGATION     Family History:  Family History  Problem Relation Age of Onset  . Liver disease Mother   . Sarcoidosis Mother   . Emphysema Father   . Seizures Neg Hx    Social History:  Social History   Substance and Sexual Activity  Alcohol Use No  . Alcohol/week: 0.0 oz     Social History   Substance and Sexual Activity  Drug Use Yes  . Types: Oxycodone, Benzodiazepines   Comment: oxy 7m daily and xanax 273mdaily    Social History   Socioeconomic History  . Marital status: Married    Spouse name: None  . Number of children: 2  . Years of education: 7th grade  . Highest education level: None  Social Needs  . Financial resource strain: None  . Food insecurity - worry: None  . Food insecurity - inability: None  . Transportation needs - medical: None  . Transportation needs - non-medical: None  Occupational History  . None  Tobacco Use  . Smoking status: Current Every Day Smoker    Packs/day: 1.00    Types: Cigarettes  . Smokeless tobacco: Never Used  Substance and Sexual Activity  . Alcohol use: No    Alcohol/week: 0.0 oz  . Drug use: Yes    Types: Oxycodone, Benzodiazepines    Comment: oxy 6049maily and xanax 2mg67mily  . Sexual activity: Not Currently  Other Topics Concern  . None  Social History Narrative   Patient is separated    Patient is right handed.   Patient drinks 3-4 sodas daily.  Additional Social History:    Pain Medications: denies Prescriptions: denies Over the Counter: buying pain pills off the street History of alcohol / drug use?: Yes Name of Substance 1: Pt tests positive for opiates, oxycodone, amphetamines, methamphetamines, benzos, and cocaine 1 - Age of First Use: Pt reports using pain pills for @ 2 years, cocaine for @ one year, and meth for a few months 1 - Frequency: "off and on" 1 - Duration: ongoing  Sleep: improving   Appetite:   improving   Current Medications: Current Facility-Administered Medications  Medication Dose Route Frequency Provider Last Rate Last Dose  . acetaminophen (TYLENOL) tablet 650 mg  650 mg Oral Q6H PRN Rankin, Shuvon B, NP   650 mg at 04/17/17 0748  . alum & mag hydroxide-simeth (MAALOX/MYLANTA) 200-200-20 MG/5ML suspension 30 mL  30 mL Oral Q4H PRN Rankin, Shuvon B, NP      . cephALEXin (KEFLEX) capsule 500 mg  500 mg Oral Q8H Lindon Romp A, NP   500 mg at 04/17/17 [phone]  . citalopram (CELEXA) tablet 20 mg  20 mg Oral Daily Rankin, Shuvon B, NP   20 mg at 04/17/17 0747  . dicyclomine (BENTYL) tablet 20 mg  20 mg Oral Q6H PRN Lindon Romp A, NP      . hydrOXYzine (ATARAX/VISTARIL) tablet 25 mg  25 mg Oral Q6H PRN Rankin, Shuvon B, NP   25 mg at 04/16/17 1442  . Influenza vac split quadrivalent PF (FLUARIX) injection 0.5 mL  0.5 mL Intramuscular Tomorrow-1000 Cobos, Fernando A, MD      . loperamide (IMODIUM) capsule 2-4 mg  2-4 mg Oral PRN Lindon Romp A, NP      . magnesium hydroxide (MILK OF MAGNESIA) suspension 30 mL  30 mL Oral Daily PRN Rankin, Shuvon B, NP      . methocarbamol (ROBAXIN) tablet 500 mg  500 mg Oral Q8H PRN Lindon Romp A, NP   500 mg at 04/16/17 2048  . naproxen (NAPROSYN) tablet 500 mg  500 mg Oral BID PRN Lindon Romp A, NP   500 mg at 04/16/17 2048  . nicotine (NICODERM CQ - dosed in mg/24 hours) patch 21 mg  21 mg Transdermal Daily Cobos, Myer Peer, MD   21 mg at 04/17/17 0749  . ondansetron (ZOFRAN-ODT) disintegrating tablet 4 mg  4 mg Oral Q6H PRN Lindon Romp A, NP      . traZODone (DESYREL) tablet 50 mg  50 mg Oral QHS Rankin, Shuvon B, NP   50 mg at 04/16/17 2102    Lab Results: No results found for this or any previous visit (from the past 48 hour(s)).  Blood Alcohol level:  Lab Results  Component Value Date   [facility] <11 02/54/2706    Metabolic Disorder Labs: No results found for: HGBA1C, MPG No results found for: PROLACTIN No results found for: CHOL, TRIG,  HDL, CHOLHDL, VLDL, LDLCALC  Physical Findings: AIMS: Facial and Oral Movements Muscles of Facial Expression: None, normal Lips and Perioral Area: None, normal Jaw: None, normal Tongue: None, normal,Extremity Movements Upper (arms, wrists, hands, fingers): None, normal Lower (legs, knees, ankles, toes): None, normal, Trunk Movements Neck, shoulders, hips: None, normal, Overall Severity Severity of abnormal movements (highest score from questions above): None, normal Incapacitation due to abnormal movements: None, normal Patient's awareness of abnormal movements (rate only patient's report): No Awareness, Dental Status Current problems with teeth and/or dentures?: No Does patient usually wear dentures?: No  CIWA:    COWS:  COWS  Total Score: 0  Musculoskeletal: Strength & Muscle Tone: within normal limits Gait & Station: normal Patient leans: N/A  Psychiatric Specialty Exam: Physical Exam  ROS denies chest pain, no shortness of breath, no vomiting   Blood pressure 119/79, pulse 97, temperature 98.6 F (37 C), temperature source Oral, resp. rate 20, height _0  (1.626 m), weight 77.1 kg (170 lb).Body mass index is 29.18 kg/m.  General Appearance: improving grooming   Eye Contact:  Fair  Speech:  Normal Rate  Volume:  Normal  Mood:  improving mood, states she feels better   Affect:  appropriate, reactive, vaguely anxious  Thought Process:  Linear and Descriptions of Associations: Intact  Orientation:  Other:  fully alert and attentive   Thought Content:  denies hallucinations, no delusions expressed, does not appear internally preoccupied   Suicidal Thoughts:  No denies suicidal or self injurious ideations, denies homicidal or violent ideations, contracts for safety on unit   Homicidal Thoughts:  No  Memory:  recent and remote grossly intact   Judgement:  Other:  improving   Insight:  improving   Psychomotor Activity:  Normal  Concentration:  Concentration: Good and  Attention Span: Good  Recall:  Good  Fund of Knowledge:  Good  Language:  Good  Akathisia:  Negative  Handed:  Right  AIMS (if indicated):     Assets:  Desire for Improvement Resilience  ADL's:  Intact  Cognition:  WNL  Sleep:  Number of Hours: 6.75    Assessment - patient is presenting with improving mood and range of affect . She remains vaguely anxious, but in general presents with reactive affect. She denies and does not currently present with active psychotic symptoms. She attributes recent decompensation to methamphetamine abuse and currently describes motivation in sobriety and abstinence . Thus far has tolerated medications well .  Treatment Plan Summary: Daily contact with patient to assess and evaluate symptoms and progress in treatment, Medication management, Plan inpatient treatment  and medications as below Encourage group and milieu participation to work on coping skills and symptom reduction Treatment team working on disposition planning options  Continue to encourage efforts to remain sober, abstinent- today we discussed negative impact methamphetamine can have on mental and physical health, and importance of avoiding people, places and situations she associates with drug use in order to decrease risk of relapse. Continue Celexa 20 mgrs QDAY for depression and anxiety  Continue Trazodone 50 mgrs QHS PRN for insomnia as needed Continue Vistaril 25 mgrs Q 6 hours PRN for anxiety as needed   Jenne Campus, MD 04/17/2017, 12:23 PM

## 2017-04-17 NOTE — Progress Notes (Signed)
D. Pt observed in dayroom interacting with peer. Pt has been calm and cooperative, attending groups. Pt continues to endorse chronic back pain, 5/10, and headaches. Per pt's self inventory, pt rates her depression a 3/10, hopelessness a '0', and anxiety a 2/10. Pt states that her goal today is "to feel better about myself". Pt currently denies SI/HI and AVH and agrees to contact staff before acting on any harmful thoughts.  A. Labs and vitals monitored. Pt compliant with medications. Pt supported emotionally and encouraged to express concerns and ask questions.   R. Pt remains safe with 15 minute checks.

## 2017-04-17 NOTE — Progress Notes (Signed)
Pt attended evening wrap up group and stated today was a 9, she got to see her granddaughter and child so that made her day.

## 2017-04-17 NOTE — [facility] Group Notes (Signed)
[facility]  Date/Time:  04/17/2017 10:00-11:00AM  Type of Therapy and Topic:  Group Therapy:  Healthy and Unhealthy Supports  Participation Level:  Active   Description of Group:  Patients in this group were introduced to the idea of adding a variety of healthy supports to address the various needs in their lives. The picture on the front of Sunday's workbook was used to demonstrate why more supports are needed in every patient's life.  Patients identified and described healthy supports versus unhealthy supports in general, then gave examples of each in their own lives.   They discussed what additional healthy supports could be helpful in their recovery and wellness after discharge in order to prevent future hospitalizations.   An emphasis was placed on using counselor, doctor, therapy groups, 12-step groups, and problem-specific support groups to expand supports.  They also worked as a group on developing a specific plan for several patients to deal with unhealthy supports through boundary-setting, psychoeducation with loved ones, and even termination of relationships.   Therapeutic Goals:   1)  discuss importance of adding supports to stay well once out of the hospital  2)  compare healthy versus unhealthy supports and identify some examples of each  3)  generate ideas and descriptions of healthy supports that can be added  4)  offer mutual support about how to address unhealthy supports  5)  encourage active participation in and adherence to discharge plan    Summary of Patient Progress:  The patient shared some during group, was mostly attentive.   Therapeutic Modalities:   Motivational Interviewing Brief Solution-Focused Therapy  Ambrose MantleMareida Grossman-Orr, LCSW 04/17/2017, 11:00AM

## 2017-04-18 NOTE — Progress Notes (Signed)
D: When asked about her day pt stated, "I feel fine now that I see they actually care". When asked who "they" are. Pt stated, "my daughter". Denied having a/h stating it "when I was on drugs". Pt also informed the writer that "her old man passed away 2 yrs ago on the 16 th." Stated, "that's why they put me in here because I was talking about him". Pt has no questions or concerns.    A:  Support and encouragement was offered. 15 min checks continued for safety.  R: Pt remains safe.

## 2017-04-18 NOTE — Progress Notes (Signed)
[facility] MD Progress Note  04/18/2017 8:41 AM Martha Chen  MRN:  [id number] Subjective: patient states she is feeling better and as she improves she is becoming more focused on discharging soon. She states she is missing her family, her granddaughter, and wants to spend thanksgiving with her family.  States her family visited her yesterday, and this helped her feel much better as well . Denies medication side effects . Does report having a headache today, but does not feel it is related to medication issues . Denies suicidal ideations.  Objective : I have reviewed chart and have met with patient. Patient reports she is feeling better and presents with improving mood and range of affect .  Denies suicidal ideations. She is expressing hope to be discharged soon. Thus far tolerating Celexa trial well - as noted, she does not feel headache she is reporting today is related to Celexa, as she has been on this medication in the past without side effects. We have reviewed importance of avoiding people, places and situations she associates with drug abuse, in order to decrease risk of relapse. At this time denies cravings .    Principal Problem: MDD (major depressive disorder), recurrent severe, without psychosis ([place]) Diagnosis:   Patient Active Problem List   Diagnosis Date Noted  . MDD (major depressive disorder), recurrent severe, without psychosis ([place]) [F33.2] 04/16/2017  . Polysubstance abuse ([place]) [F19.10] 04/16/2017  . Other stimulant dependence with stimulant-induced mood disorder ([place]) [F15.24]   . MDD (major depressive disorder) [F32.9] 04/15/2017  . Major depressive disorder, recurrent episode ([place]) [F33.9] 11/02/2016  . Drug overdose [T50.901A] 05/22/2016  . Major depression, recurrent ([place]) [F33.9] 05/21/2016  . Seizures ([place]) [R56.9]   . Headache [R51] 04/29/2014  . Seizure ([place]) [R56.9] 04/17/2014  . Smoking [F17.200] 04/17/2014  . Nausea and vomiting [R11.2] 04/17/2014  .  Metabolic acidosis [A12.8] 04/17/2014  . Leukocytosis [D72.829] 04/17/2014  . Hypokalemia [E87.6] 04/17/2014   Total Time spent with patient: 20 minutes Past Medical History:  Past Medical History:  Diagnosis Date  . Headache 04/29/2014  . Seizures ([place])     Past Surgical History:  Procedure Laterality Date  . BACK SURGERY    . CHOLECYSTECTOMY    . TUBAL LIGATION     Family History:  Family History  Problem Relation Age of Onset  . Liver disease Mother   . Sarcoidosis Mother   . Emphysema Father   . Seizures Neg Hx    Social History:  Social History   Substance and Sexual Activity  Alcohol Use No  . Alcohol/week: 0.0 oz     Social History   Substance and Sexual Activity  Drug Use Yes  . Types: Oxycodone, Benzodiazepines   Comment: oxy 32m daily and xanax 257mdaily    Social History   Socioeconomic History  . Marital status: Married    Spouse name: None  . Number of children: 2  . Years of education: 7th grade  . Highest education level: None  Social Needs  . Financial resource strain: None  . Food insecurity - worry: None  . Food insecurity - inability: None  . Transportation needs - medical: None  . Transportation needs - non-medical: None  Occupational History  . None  Tobacco Use  . Smoking status: Current Every Day Smoker    Packs/day: 1.00    Types: Cigarettes  . Smokeless tobacco: Never Used  Substance and Sexual Activity  . Alcohol use: No    Alcohol/week: 0.0 oz  .  Drug use: Yes    Types: Oxycodone, Benzodiazepines    Comment: oxy 59m daily and xanax 260mdaily  . Sexual activity: Not Currently  Other Topics Concern  . None  Social History Narrative   Patient is separated    Patient is right handed.   Patient drinks 3-4 sodas daily.   Additional Social History:    Pain Medications: denies Prescriptions: denies Over the Counter: buying pain pills off the street History of alcohol / drug use?: Yes Name of Substance 1: Pt tests  positive for opiates, oxycodone, amphetamines, methamphetamines, benzos, and cocaine 1 - Age of First Use: Pt reports using pain pills for @ 2 years, cocaine for @ one year, and meth for a few months 1 - Frequency: "off and on" 1 - Duration: ongoing  Sleep: improving   Appetite:  improving   Current Medications: Current Facility-Administered Medications  Medication Dose Route Frequency Provider Last Rate Last Dose  . acetaminophen (TYLENOL) tablet 650 mg  650 mg Oral Q6H PRN Rankin, Shuvon B, NP   650 mg at 04/17/17 0748  . alum & mag hydroxide-simeth (MAALOX/MYLANTA) 200-200-20 MG/5ML suspension 30 mL  30 mL Oral Q4H PRN Rankin, Shuvon B, NP      . citalopram (CELEXA) tablet 20 mg  20 mg Oral Daily Rankin, Shuvon B, NP   20 mg at 04/18/17 0827  . dicyclomine (BENTYL) tablet 20 mg  20 mg Oral Q6H PRN BeLindon Romp, NP      . hydrOXYzine (ATARAX/VISTARIL) tablet 25 mg  25 mg Oral Q6H PRN Rankin, Shuvon B, NP   25 mg at 04/17/17 1429  . Influenza vac split quadrivalent PF (FLUARIX) injection 0.5 mL  0.5 mL Intramuscular Tomorrow-1000 Cobos, Fernando A, MD      . loperamide (IMODIUM) capsule 2-4 mg  2-4 mg Oral PRN BeLindon Romp, NP      . magnesium hydroxide (MILK OF MAGNESIA) suspension 30 mL  30 mL Oral Daily PRN Rankin, Shuvon B, NP      . methocarbamol (ROBAXIN) tablet 500 mg  500 mg Oral Q8H PRN BeLindon Romp, NP   500 mg at 04/17/17 2144  . naproxen (NAPROSYN) tablet 500 mg  500 mg Oral BID PRN BeRozetta NunneryNP   500 mg at 04/17/17 2144  . nicotine (NICODERM CQ - dosed in mg/24 hours) patch 21 mg  21 mg Transdermal Daily Cobos, FeMyer PeerMD   21 mg at 04/18/17 0828  . ondansetron (ZOFRAN-ODT) disintegrating tablet 4 mg  4 mg Oral Q6H PRN BeLindon Romp, NP      . traZODone (DESYREL) tablet 50 mg  50 mg Oral QHS Rankin, Shuvon B, NP   50 mg at 04/17/17 2144    Lab Results: No results found for this or any previous visit (from the past 48 hour(s)).  Blood Alcohol level:  Lab  Results  Component Value Date   [facility]11 [id number]  Metabolic Disorder Labs: No results found for: HGBA1C, MPG No results found for: PROLACTIN No results found for: CHOL, TRIG, HDL, CHOLHDL, VLDL, LDLCALC  Physical Findings: AIMS: Facial and Oral Movements Muscles of Facial Expression: None, normal Lips and Perioral Area: None, normal Jaw: None, normal Tongue: None, normal,Extremity Movements Upper (arms, wrists, hands, fingers): None, normal Lower (legs, knees, ankles, toes): None, normal, Trunk Movements Neck, shoulders, hips: None, normal, Overall Severity Severity of abnormal movements (highest score from questions above): None, normal Incapacitation due to abnormal movements: None,  normal Patient's awareness of abnormal movements (rate only patient's report): No Awareness, Dental Status Current problems with teeth and/or dentures?: No Does patient usually wear dentures?: No  CIWA:    COWS:  COWS Total Score: 5  Musculoskeletal: Strength & Muscle Tone: within normal limits Gait & Station: normal Patient leans: N/A  Psychiatric Specialty Exam: Physical Exam  ROS describes headache today, denies chest pain, no shortness of breath, no vomiting   Blood pressure 121/78, pulse 90, temperature 98.3 F (36.8 C), temperature source Oral, resp. rate 14, height 5' 4"  (1.626 m), weight 77.1 kg (170 lb).Body mass index is 29.18 kg/m.  General Appearance: improving grooming   Chen Contact:  Good  Speech:  Normal Rate  Volume:  Normal  Mood:  mood is improving , reports she is feeling better overall   Affect:  more reactive  Thought Process:  Linear and Descriptions of Associations: Intact  Orientation:  Other:  fully alert and attentive   Thought Content:  denies hallucinations, no delusions expressed, does not appear internally preoccupied   Suicidal Thoughts:  No denies suicidal or self injurious ideations, denies homicidal or violent ideations, contracts for safety on unit    Homicidal Thoughts:  No  Memory:  recent and remote grossly intact   Judgement:  Other:  improved  Insight:  improved   Psychomotor Activity:  Normal  Concentration:  Concentration: Good and Attention Span: Good  Recall:  Good  Fund of Knowledge:  Good  Language:  Good  Akathisia:  Negative  Handed:  Right  AIMS (if indicated):     Assets:  Desire for Improvement Resilience  ADL's:  Intact  Cognition:  WNL  Sleep:  Number of Hours: 6.75    Assessment - at this time patient presents improved, with improving mood and range of affect. Denies suicidal ideations , presents future oriented, with desire to discharge home soon. Thus far tolerating medications well . Denies cravings for amphetamines and expresses insight regarding negative impact that substance abuse has played on recent events and need for readmission.  Treatment Plan Summary: Treatment Plan reviewed as below today 11/19   Daily contact with patient to assess and evaluate symptoms and progress in treatment, Medication management, Plan inpatient treatment  and medications as below Encourage group and milieu participation to work on coping skills and symptom reduction Treatment team working on disposition planning options  Continue to encourage efforts to remain sober, abstinent Continue Celexa 20 mgrs QDAY for depression and anxiety  Continue Trazodone 50 mgrs QHS PRN for insomnia as needed Continue Vistaril 25 mgrs Q 6 hours PRN for anxiety as needed   Jenne Campus, MD 04/18/2017, 8:41 AM   Patient ID: Martha Chen, female   DOB: 03/28/1976, 41 y.o.   MRN: [id number]

## 2017-04-18 NOTE — Progress Notes (Signed)
Patient stated having a good day. Patient rated her day a 10. Patient stated she will be going home tomorrow.

## 2017-04-18 NOTE — [facility] Suicide Risk Assessment (Signed)
[facility] INPATIENT:  Family/Significant Other Suicide Prevention Education  Suicide Prevention Education:  Education Completed; Martha Chen (daughter [phone]), has been identified by the patient as the family member/significant other with whom the patient will be residing, and identified as the person(s) who will aid the patient in the event of a mental health crisis (suicidal ideations/suicide attempt).  With written consent from the patient, the family member/significant other has been provided the following suicide prevention education, prior to the and/or following the discharge of the patient.  The suicide prevention education provided includes the following:  Suicide risk factors  Suicide prevention and interventions  National Suicide Hotline telephone number  [facility] assessment telephone number  [facility] Assistance 911  [facility] and/or [facility]idential [facility] Crisis [facility] telephone number  Request made of family/significant other to:  Remove weapons (e.g., guns, rifles, knives), all items previously/currently identified as safety concern.    Remove drugs/medications (over-the-counter, prescriptions, illicit drugs), all items previously/currently identified as a safety concern.  The family member/significant other verbalizes understanding of the suicide prevention education information provided.  The family member/significant other agrees to remove the items of safety concern listed above.  Pt's daughter states that she has not had much contact with pt since she's been in the hospital because the pt is mad her for putting pt in the hospital. Pt's daughter states that she hopes pt can maintain her sobriety upon discharge. Pt's daughter also confirms that pt does not have access to guns or weapons at home.  Martha Chen, MSW, LCSWA  04/18/2017, 4:08 PM

## 2017-04-18 NOTE — Progress Notes (Signed)
D: Pt presents with a flat affect and anxious mood. Pt reports decreased depression and ongoing anxiety. Pt denies Si. Pt denies any withdrawal symptoms today.  Pt reports difficulty sleeping last night due to increased anxiety level. Pt compliant with taking meds. No side effects to meds verbalized by pt. A: Medications reviewed with pt. Medications administered as ordered per MD. Verbal support provided. Pt encouraged to attend groups. 15 minute checks performed for safety.  R: Pt complaint with tx.

## 2017-04-18 NOTE — Tx Team (Signed)
Interdisciplinary Treatment and Diagnostic Plan Update 04/18/2017 Time of Session: 9:30am  Martha SchleinCrystal Chen  MRN: [id number]  Principal Diagnosis: MDD (major depressive disorder), recurrent severe, without psychosis ([facility])  Secondary Diagnoses: Principal Problem:   MDD (major depressive disorder), recurrent severe, without psychosis ([facility]) Active Problems:   Other stimulant dependence with stimulant-induced mood disorder ([facility])   Polysubstance abuse ([facility])   Current Medications:  Current Facility-Administered Medications  Medication Dose Route Frequency Provider Last Rate Last Dose  . acetaminophen (TYLENOL) tablet 650 mg  650 mg Oral Q6H PRN Rankin, Shuvon B, NP   650 mg at 04/17/17 0748  . alum & mag hydroxide-simeth (MAALOX/MYLANTA) 200-200-20 MG/5ML suspension 30 mL  30 mL Oral Q4H PRN Rankin, Shuvon B, NP      . citalopram (CELEXA) tablet 20 mg  20 mg Oral Daily Rankin, Shuvon B, NP   20 mg at 04/18/17 0827  . dicyclomine (BENTYL) tablet 20 mg  20 mg Oral Q6H PRN Nira ConnBerry, Jason A, NP      . hydrOXYzine (ATARAX/VISTARIL) tablet 25 mg  25 mg Oral Q6H PRN Rankin, Shuvon B, NP   25 mg at 04/17/17 1429  . Influenza vac split quadrivalent PF (FLUARIX) injection 0.5 mL  0.5 mL Intramuscular Tomorrow-1000 Cobos, Fernando A, MD      . loperamide (IMODIUM) capsule 2-4 mg  2-4 mg Oral PRN Nira ConnBerry, Jason A, NP      . magnesium hydroxide (MILK OF MAGNESIA) suspension 30 mL  30 mL Oral Daily PRN Rankin, Shuvon B, NP      . methocarbamol (ROBAXIN) tablet 500 mg  500 mg Oral Q8H PRN Nira ConnBerry, Jason A, NP   500 mg at 04/17/17 2144  . naproxen (NAPROSYN) tablet 500 mg  500 mg Oral BID PRN Jackelyn PolingBerry, Jason A, NP   500 mg at 04/17/17 2144  . nicotine (NICODERM CQ - dosed in mg/24 hours) patch 21 mg  21 mg Transdermal Daily Cobos, Rockey SituFernando A, MD   21 mg at 04/18/17 0828  . ondansetron (ZOFRAN-ODT) disintegrating tablet 4 mg  4 mg Oral Q6H PRN Jackelyn PolingBerry, Jason A, NP      . traZODone (DESYREL) tablet 50 mg  50 mg Oral QHS  Rankin, Shuvon B, NP   50 mg at 04/17/17 2144    PTA Medications: Medications Prior to Admission  Medication Sig Dispense Refill Last Dose  . hydrOXYzine (ATARAX/VISTARIL) 25 MG tablet Take 1 tablet (25 mg total) by mouth every 6 (six) hours as needed for anxiety. 60 tablet 0 03/22/2017 at prn  . nicotine (NICODERM CQ - DOSED IN MG/24 HOURS) 21 mg/24hr patch Place 1 patch (21 mg total) onto the skin daily at 6 (six) AM. For smoking cessation 28 patch 0 Not Taking at Unknown time  . citalopram (CELEXA) 20 MG tablet Take 1 tablet (20 mg total) by mouth daily. For depression 30 tablet 0 Past Week at Unknown time  . traZODone (DESYREL) 50 MG tablet Take 1 tablet (50 mg total) by mouth at bedtime. For sleep 30 tablet 0 03/22/2017 at Unknown time    Treatment Modalities: Medication Management, Group therapy, Case management,  1 to 1 session with clinician, Psychoeducation, Recreational therapy.  Patient Stressors: Financial difficulties Health problems Substance abuse Other: Chronic pain Patient Strengths: Capable of independent living Wellsite geologistCommunication skills General fund of knowledge Motivation for treatment/growth  Physician Treatment Plan for Primary Diagnosis: MDD (major depressive disorder), recurrent severe, without psychosis (HCC) Long Term Goal(s): Improvement in symptoms so as ready for discharge Short  Term Goals: Ability to identify changes in lifestyle to reduce recurrence of condition will improve Ability to verbalize feelings will improve Ability to disclose and discuss suicidal ideas Ability to demonstrate self-control will improve Ability to identify and develop effective coping behaviors will improve Ability to identify and develop effective coping behaviors will improve Ability to maintain clinical measurements within normal limits will improve Compliance with prescribed medications will improve Ability to identify triggers associated with substance abuse/mental health  issues will improve  Medication Management: Evaluate patient's response, side effects, and tolerance of medication regimen.  Therapeutic Interventions: 1 to 1 sessions, Unit Group sessions and Medication administration.  Evaluation of Outcomes: Progressing  Physician Treatment Plan for Secondary Diagnosis: Principal Problem:   MDD (major depressive disorder), recurrent severe, without psychosis (HCC) Active Problems:   Other stimulant dependence with stimulant-induced mood disorder (H[facility])   Polysubstance abuse ([facility])  Long Term Goal(s): Improvement in symptoms so as ready for discharge  Short Term Goals: Ability to identify changes in lifestyle to reduce recurrence of condition will improve Ability to verbalize feelings will improve Ability to disclose and discuss suicidal ideas Ability to demonstrate self-control will improve Ability to identify and develop effective coping behaviors will improve Ability to identify and develop effective coping behaviors will improve Ability to maintain clinical measurements within normal limits will improve Compliance with prescribed medications will improve Ability to identify triggers associated with substance abuse/mental health issues will improve  Medication Management: Evaluate patient's response, side effects, and tolerance of medication regimen.  Therapeutic Interventions: 1 to 1 sessions, Unit Group sessions and Medication administration.  Evaluation of Outcomes: Progressing  RN Treatment Plan for Primary Diagnosis: MDD (major depressive disorder), recurrent severe, without psychosis (HCC) Long Term Goal(s): Knowledge of disease and therapeutic regimen to maintain health will improve  Short Term Goals: Compliance with prescribed medications will improve  Medication Management: RN will administer medications as ordered by provider, will assess and evaluate patient's response and provide education to patient for prescribed medication. RN  will report any adverse and/or side effects to prescribing provider.  Therapeutic Interventions: 1 on 1 counseling sessions, Psychoeducation, Medication administration, Evaluate responses to treatment, Monitor vital signs and CBGs as ordered, Perform/monitor CIWA, COWS, AIMS and Fall Risk screenings as ordered, Perform wound care treatments as ordered.  Evaluation of Outcomes: Progressing  LCSW Treatment Plan for Primary Diagnosis: MDD (major depressive disorder), recurrent severe, without psychosis (HCC) Long Term Goal(s): Safe transition to appropriate next level of care at discharge, Engage patient in therapeutic group addressing interpersonal concerns. Short Term Goals: Engage patient in aftercare planning with referrals and resources, Increase ability to appropriately verbalize feelings, Increase emotional regulation, Identify triggers associated with mental health/substance abuse issues and Increase skills for wellness and recovery  Therapeutic Interventions: Assess for all discharge needs, 1 to 1 time with Social worker, Explore available resources and support systems, Assess for adequacy in community support network, Educate family and significant other(s) on suicide prevention, Complete Psychosocial Assessment, Interpersonal group therapy.  Evaluation of Outcomes: Progressing  Progress in Treatment: Attending groups: Yes Participating in groups: Yes Taking medication as prescribed: Yes, MD continues to assess for medication changes as needed Toleration medication: Yes, no side effects reported at this time Family/Significant other contact made: No, CSW attempting contact with pt's daughter. Patient understands diagnosis: Developing insight  Discussing patient identified problems/goals with staff: Yes Medical problems stabilized or resolved: Yes Denies suicidal/homicidal ideation: Yes  Issues/concerns per patient self-inventory: None Other: N/A  New problem(s) identified: None  identified at this time.   New Short Term/Long Term Goal(s): None identified at this time.   Discharge Plan or Barriers: Pt will return home and follow up outpatient with [facility].  Reason for Continuation of Hospitalization:  Anxiety  Depression Medication stabilization Suicidal ideation  Estimated Length of Stay: 1-3 days; Estimated discharge date 04/21/17  Attendees: Patient: 04/18/2017 11:37 AM  Physician: Dr. Jama Flavorsobos 04/18/2017 11:37 AM  Nursing: Foy Guadalajarahrista, RN; Rodell PernaPatrice, RN 04/18/2017 11:37 AM  RN Care Manager: Onnie BoerJennifer Clark, RN 04/18/2017 11:37 AM  Social Worker: Donnelly StagerLynn Latasha Puskas, LCSWA 04/18/2017 11:37 AM  Recreational Therapist:  04/18/2017 11:37 AM  Other: Armandina StammerAgnes Nwoko, NP 04/18/2017 11:37 AM  Other:  04/18/2017 11:37 AM  Other: 04/18/2017 11:37 AM  Scribe for Treatment Team: Jonathon JordanLynn B Gerik Coberly, MSW,LCSWA 04/18/2017 11:37 AM

## 2017-04-18 NOTE — [facility] Group Notes (Signed)
[facility] 04/18/2017 1:15pm  Type of Therapy: Mental Health Association Presentation  Participation Level: Active  Participation Quality: Attentive  Affect: Appropriate  Cognitive: Oriented  Insight: Developing/Improving  Engagement in Therapy: Engaged  Modes of Intervention: Discussion, Education and Socialization  Summary of Progress/Problems: Mental Health Association (MHA) Speaker came to talk about his personal journey with living with a mental health diagnosis. The pt processed ways by which to relate to the speaker. MHA speaker provided handouts and educational information pertaining to groups and services offered by the [facility]. Pt was engaged in speaker's presentation and was receptive to resources provided.    Martha JordanLynn B Eissa Buchberger, MSW, LCSWA 04/18/2017 3:57 PM

## 2017-04-18 NOTE — Progress Notes (Signed)
Recreation Therapy Notes  Date: 04/18/17 Time: 0930 Location:  [address] Dayroom  Group Topic: Stress Management  Goal Area(s) Addresses:  Patient will verbalize importance of using healthy stress management.  Patient will identify positive emotions associated with healthy stress management.   Behavioral Response: Engaged  Intervention: Stress Management  Activity :  Meditation.  LRT introduced the stress management technique of meditation.  LRT read a script for patients to exam the things they need to shed the baggage they have been carrying to prepare for the new things to come.  Education:  Stress Management, Discharge Planning.   Education Outcome: Acknowledges edcuation/In group clarification offered/Needs additional education  Clinical Observations/Feedback: Pt attended group.     Petr Bontempo, LRT/CTRS         Melanee Cordial A 04/18/2017 11:23 AM 

## 2017-04-19 ENCOUNTER — Encounter (HOSPITAL_COMMUNITY): Payer: Self-pay | Admitting: Registered Nurse

## 2017-04-19 DIAGNOSIS — F17210 Nicotine dependence, cigarettes, uncomplicated: Secondary | ICD-10-CM | POA: Diagnosis not present

## 2017-04-19 DIAGNOSIS — F332 Major depressive disorder, recurrent severe without psychotic features: Secondary | ICD-10-CM | POA: Diagnosis not present

## 2017-04-19 DIAGNOSIS — G4700 Insomnia, unspecified: Secondary | ICD-10-CM

## 2017-04-19 DIAGNOSIS — F1910 Other psychoactive substance abuse, uncomplicated: Secondary | ICD-10-CM | POA: Diagnosis not present

## 2017-04-19 DIAGNOSIS — F1524 Other stimulant dependence with stimulant-induced mood disorder: Secondary | ICD-10-CM | POA: Diagnosis not present

## 2017-04-19 NOTE — [facility] Group Notes (Signed)
LCSW Group Therapy Note  04/19/2017 1:15pm  Type of Therapy/Topic:  Group Therapy:  Balance in Life  Participation Level:  Active  Description of Group:    This group will address the concept of balance and how it feels and looks when one is unbalanced. Patients will be encouraged to process areas in their lives that are out of balance and identify reasons for remaining unbalanced. Facilitators will guide patients in utilizing problem-solving interventions to address and correct the stressor making their life unbalanced. Understanding and applying boundaries will be explored and addressed for obtaining and maintaining a balanced life. Patients will be encouraged to explore ways to assertively make their unbalanced needs known to significant others in their lives, using other group members and facilitator for support and feedback.  Therapeutic Goals: 1. Patient will identify two or more emotions or situations they have that consume much of in their lives. 2. Patient will identify signs/triggers that life has become out of balance:  3. Patient will identify two ways to set boundaries in order to achieve balance in their lives:  4. Patient will demonstrate ability to communicate their needs through discussion and/or role plays  Summary of Patient Progress: Participated in group discussion of concept of balance, stressors that contribute to lack of balance and emotional feeling states when out of balance. Identified the need for her family to "recognize that I am the mother.  They are the reason that I am in here."  Therapeutic Modalities:   Cognitive Behavioral Therapy Solution-Focused Therapy Assertiveness Training  Sallee Langenne C Geron Mulford, [place] 04/19/2017 2:43 PM

## 2017-04-19 NOTE — Progress Notes (Signed)
[facility]  04/19/2017 3:42 PM Martha SchleinCrystal Chen  MRN:  [id number]    Subjective: "I'm great.  The meds are helping and I'm ready to go."  Patient reports that she is just here "because I told them I took an overdose of Benadryl, but I didn't there at the bottom of my pocket book."   Objective :  Martha Schleinrystal Sagun, 41 y.o., female patient seen face to face by this provider, chart reviewed and discussed with treatment team on 04/19/17.  On evaluation Avanni Ciaravino in day room interacting with peers.  Patient is calm/cooperative, alert/oriented x 4, with pleasant affect, and does not appear to be responding to internal/external stimuli.  Patient's feelings and progress discussed.  Patient reports eating/sleeping without difficulty, tolerating medications without adverse reactions, and attending/participating in group sessions.  Patient denied suicidal/homicidal/self-harm ideation, psychosis, and paranoia.  Patient is hoping to be discharged home tomorrow so that she will be home with her family on Thanksgiving and also so she can prepare Thanksgiving dinner for her family.  Reassurance, support, and encouragement provided.   Principal Problem: MDD (major depressive disorder), recurrent severe, without psychosis (HCC) Diagnosis:   Patient Active Problem List   Diagnosis Date Noted  . MDD (major depressive disorder), recurrent severe, without psychosis (HCC) [F33.2] 04/16/2017  . Polysubstance abuse (HCC) [F19.10] 04/16/2017  . 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 MDD (major depressive disorder) [F32.9] 04/15/2017  . Major depressive disorder, recurrent episode (HCC) [F33.9] 11/02/2016  . Drug overdose [T50.901A] 05/22/2016  . Major depression, recurrent (HCC) [F33.9] 05/21/2016  . Seizures (HCC) [R56.9]   . Headache [R51] 04/29/2014  . Seizure (HCC) [R56.9] 04/17/2014  . Smoking [F17.200] 04/17/2014  . Nausea and vomiting [R11.2] 04/17/2014  .  Metabolic acidosis [E87.2] 04/17/2014  . Leukocytosis [D72.829] 04/17/2014  . Hypokalemia [E87.6] 04/17/2014   Total Time spent with patient: 15 minutes Past Medical History:  Past Medical History:  Diagnosis Date  . Headache 04/29/2014  . Seizures (HCC)     Past Surgical History:  Procedure Laterality Date  . BACK SURGERY    . CHOLECYSTECTOMY    . TUBAL LIGATION     Family History:  Family History  Problem Relation Age of Onset  . Liver disease Mother   . Sarcoidosis Mother   . Emphysema Father   . Seizures Neg Hx    Social History:  Social History   Substance and Sexual Activity  Alcohol Use No  . Alcohol/week: 0.0 oz     Social History   Substance and Sexual Activity  Drug Use Yes  . Types: Oxycodone, Benzodiazepines   Comment: oxy 60mg  daily and xanax 2mg  daily    Social History   Socioeconomic History  . Marital status: Married    Spouse name: None  . Number of children: 2  . Years of education: 7th grade  . Highest education level: None  Social Needs  . Financial resource strain: None  . Food insecurity - worry: None  . Food insecurity - inability: None  . Transportation needs - medical: None  . Transportation needs - non-medical: None  Occupational History  . None  Tobacco Use  . Smoking status: Current Every Day Smoker    Packs/day: 1.00    Types: Cigarettes  . Smokeless tobacco: Never Used  Substance and Sexual Activity  . Alcohol use: No    Alcohol/week: 0.0 oz  . Drug use: Yes    Types: Oxycodone, Benzodiazepines  Comment: oxy 60mg  daily and xanax 2mg  daily  . Sexual activity: Not Currently  Other Topics Concern  . None  Social History Narrative   Patient is separated    Patient is right handed.   Patient drinks 3-4 sodas daily.   Additional Social History:    Pain Medications: denies Prescriptions: denies Over the Counter: buying pain pills off the street History of alcohol / drug use?: Yes Name of Substance 1: Pt tests  positive for opiates, oxycodone, amphetamines, methamphetamines, benzos, and cocaine 1 - Age of First Use: Pt reports using pain pills for @ 2 years, cocaine for @ one year, and meth for a few months 1 - Frequency: "off and on" 1 - Duration: ongoing  Sleep: Good  Appetite:  Good  Current Medications: Current Facility-Administered Medications  Medication Dose Route Frequency Provider Last Rate Last Dose  . acetaminophen (TYLENOL) tablet 650 mg  650 mg Oral Q6H PRN Yulisa Chirico B, NP   650 mg at 04/19/17 1131  . alum & mag hydroxide-simeth (MAALOX/MYLANTA) 200-200-20 MG/5ML suspension 30 mL  30 mL Oral Q4H PRN Lennix Kneisel B, NP      . citalopram (CELEXA) tablet 20 mg  20 mg Oral Daily Govani Radloff B, NP   20 mg at 04/19/17 0828  . dicyclomine (BENTYL) tablet 20 mg  20 mg Oral Q6H PRN Nira Conn A, NP      . hydrOXYzine (ATARAX/VISTARIL) tablet 25 mg  25 mg Oral Q6H PRN Reyaan Thoma B, NP   25 mg at 04/19/17 1130  . Influenza vac split quadrivalent PF (FLUARIX) injection 0.5 mL  0.5 mL Intramuscular Tomorrow-1000 Cobos, Fernando A, MD      . loperamide (IMODIUM) capsule 2-4 mg  2-4 mg Oral PRN Nira Conn A, NP      . magnesium hydroxide (MILK OF MAGNESIA) suspension 30 mL  30 mL Oral Daily PRN Amairany Schumpert B, NP      . methocarbamol (ROBAXIN) tablet 500 mg  500 mg Oral Q8H PRN Nira Conn A, NP   500 mg at 04/18/17 2218  . naproxen (NAPROSYN) tablet 500 mg  500 mg Oral BID PRN Nira Conn A, NP   500 mg at 04/19/17 0827  . nicotine (NICODERM CQ - dosed in mg/24 hours) patch 21 mg  21 mg Transdermal Daily Cobos, Rockey Situ, MD   21 mg at 04/19/17 0829  . ondansetron (ZOFRAN-ODT) disintegrating tablet 4 mg  4 mg Oral Q6H PRN Nira Conn A, NP      . traZODone (DESYREL) tablet 50 mg  50 mg Oral QHS Jody Aguinaga B, NP   50 mg at 04/18/17 2218    Lab Results: No results found for this or any previous visit (from the past 48 hour(s)).  Blood Alcohol level:  Lab Results   Component Value Date   [facility] <11 04/16/2014    Metabolic Disorder Labs: No results found for: HGBA1C, MPG No results found for: PROLACTIN No results found for: CHOL, TRIG, HDL, CHOLHDL, VLDL, LDLCALC  Physical Findings: AIMS: Facial and Oral Movements Muscles of Facial Expression: None, normal Lips and Perioral Area: None, normal Jaw: None, normal Tongue: None, normal,Extremity Movements Upper (arms, wrists, hands, fingers): None, normal Lower (legs, knees, ankles, toes): None, normal, Trunk Movements Neck, shoulders, hips: None, normal, Overall Severity Severity of abnormal movements (highest score from questions above): None, normal Incapacitation due to abnormal movements: None, normal Patient's awareness of abnormal movements (rate only patient's report): No Awareness, Dental Status  Current problems with teeth and/or dentures?: No Does patient usually wear dentures?: No  CIWA:    COWS:  COWS Total Score: 5  Musculoskeletal: Strength & Muscle Tone: within normal limits Gait & Station: normal Patient leans: N/A  Psychiatric Specialty Exam: Physical Exam  Nursing note and vitals reviewed. Constitutional: She is oriented to person, place, and time.  Neck: Normal range of motion.  Respiratory: Effort normal.  Musculoskeletal: Normal range of motion.  Neurological: She is alert and oriented to person, place, and time.  Skin: Skin is warm and dry.    Review of Systems  Psychiatric/Behavioral: Positive for depression (Stable) and substance abuse. Hallucinations: denies. Suicidal ideas: denies at this time. The patient is nervous/anxious (Stable). The patient does not have insomnia.   All other systems reviewed and are negative.  describes headache today, denies chest pain, no shortness of breath, no vomiting   Blood pressure 123/86, pulse (!) 101, temperature 98.2 F (36.8 C), temperature source Oral, resp. rate 18, height 5\' 4"  (1.626 m), weight 77.1 kg (170 lb).Body mass  index is 29.18 kg/m.  General Appearance: Casual and Neat  Eye Contact:  Good  Speech:  Normal Rate  Volume:  Normal  Mood:  "I'm great"  Affect:  Appropriate  Thought Process:  Goal Directed and Descriptions of Associations: Intact  Orientation:  Full (Time, Place, and Person)  Thought Content:  denies hallucinations, no delusions expressed, does not appear internally preoccupied   Suicidal Thoughts:  No   Homicidal Thoughts:  No  Memory:  recent and remote grossly intact   Judgement:  Fair  Insight:  Present  Psychomotor Activity:  Normal  Concentration:  Concentration: Good and Attention Span: Good  Recall:  Good  Fund of Knowledge:  Good  Language:  Good  Akathisia:  Negative  Handed:  Right  AIMS (if indicated):     Assets:  Desire for Improvement Resilience  ADL's:  Intact  Cognition:  WNL  Sleep:  Number of Hours: 6.25    Treatment Plan Summary: Daily contact with patient to assess and evaluate symptoms and progress in treatment and Medication management   Continue with current treatment plan on 04/19/17 expect where noted.  Possible discharge tomorrow. Encourage group and milieu participation to work on Pharmacologistcoping skills and symptom reduction Treatment team working on disposition planning options  Continue to encourage efforts to remain sober, abstinent Continue Celexa 20 mgrs QDAY for depression and anxiety  Continue Trazodone 50 mgrs QHS PRN for insomnia as needed Continue Vistaril 25 mgrs Q 6 hours PRN for anxiety as needed   Diantha Paxson, NP 04/19/2017, 3:42 PM
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
## 2017-04-19 NOTE — Progress Notes (Signed)
Recreation Therapy Notes    Animal-Assisted Activity (AAA) Program Checklist/Progress Notes Patient Eligibility Criteria Checklist & Daily Group note for Rec TxIntervention  Date: 11.20.2018 Time: 2:45am Location: [address] Dayroom   AAA/T Program Assumption of Risk Form signed by Patient/ or Parent Legal Guardian Yes  Patient is free of allergies or sever asthma Yes  Patient reports no fear of animals Yes  Patient reports no history of cruelty to animals Yes  Patient understands his/her participation is voluntary Yes  Behavioral Response: Did not attend.   Cashel Bellina L Jaysha Lasure, LRT/CTRS        Galdino Hinchman L 04/19/2017 3:12 PM 

## 2017-04-20 DIAGNOSIS — R569 Unspecified convulsions: Secondary | ICD-10-CM

## 2017-04-20 NOTE — Progress Notes (Signed)
Recreation Therapy Notes  Date: 04/20/17 Time: 0930 Location: [address] Dayroom  Group Topic: Stress Management  Goal Area(s) Addresses:  Patient will verbalize importance of using healthy stress management.  Patient will identify positive emotions associated with healthy stress management.   Behavioral Response: Engaged  Intervention: Stress Management  Activity :  Body Scan.  LRT introduced the stress management technique of guided body scan.  LRT played a body scan meditation from the Calm app that allowed patients to focus on whatever sensations they may have been feeling and what those sensations felt like to them.   Education:  Stress Management, Discharge Planning.   Education Outcome: Acknowledges edcuation/In group clarification offered/Needs additional education  Clinical Observations/Feedback: Pt attended group.   Caroll RancherMarjette Belvia Gotschall, LRT/CTRS         Caroll RancherLindsay, Shontell Prosser A 04/20/2017 12:23 PM

## 2017-04-20 NOTE — [facility] Group Notes (Signed)
LCSW Group Therapy Note  04/20/2017 1:15pm  Type of Therapy and Topic:  Group Therapy:  Feelings around Relapse and Recovery  Participation Level: Active   Description of Group:    Patients in this group will discuss emotions they experience before and after a relapse. They will process how experiencing these feelings, or avoidance of experiencing them, relates to having a relapse. Facilitator will guide patients to explore emotions they have related to recovery. Patients will be encouraged to process which emotions are more powerful. They will be guided to discuss the emotional reaction significant others in their lives may have to their relapse or recovery. Patients will be assisted in exploring ways to respond to the emotions of others without this contributing to a relapse.  Therapeutic Goals: 1. Patient will identify two or more emotions that lead to a relapse for them 2. Patient will identify two emotions that result when they relapse 3. Patient will identify two emotions related to recovery 4. Patient will demonstrate ability to communicate their needs through discussion and/or role plays   Therapeutic Modalities:   Cognitive Behavioral Therapy Solution-Focused Therapy Assertiveness Training Relapse Prevention Therapy   Jonathon JordanLynn B Nishant Schrecengost, MSW, [facility] PearlandCSWA 04/20/2017 3:54 PM

## 2017-04-20 NOTE — Progress Notes (Signed)
Patient ID: Martha Chen, female   DOB: 07/02/1975, 41 y.o.   MRN: [id number]  DAR: Pt. Denies SI/HI and A/V Hallucinations. She reports that her sleep last night was good, her appetite is good, her energy level is normal, and her concentration is good. She rates her depression level 2/10, her hopelessness level 0/10, and her anxiety 2/10. Patient reports that she has a headache this evening and received Tylenol. She also reported some anxiety and received PRN Vistaril. Both were effective. Support and encouragement provided to the patient. Scheduled medications administered to patient per physician's orders. Patient presnts this morning flat, and depressed however is bright in affect by this evening. She is smiling and laughing. She reports that she misses her granddaughter "Martha Chen" and is looking forward to discharging soon so she can see her again. Patient is receptive and cooperative. She is seen in the milieu and is interacting with her peers. Q15 minute checks are maintained for safety.

## 2017-04-20 NOTE — Plan of Care (Signed)
  Education: Emotional status will improve 04/20/2017 1840 - Progressing by Lenord Fellersopson, Abriel Hattery Elizabeth, RN

## 2017-04-20 NOTE — Progress Notes (Signed)
D: Patient denies SI, HI or AVH this evening. Patient presents as flat and anxious, she had just awaken from sleeping to report that she had a headache, "probably from my chronic back pain/problems".  Pt. States that her day was, "ok, I'm just ready to go".  Pt. Medicated as requested, no further needs reported at that time and patient returned back to her room.  A: Patient given emotional support from RN. Patient encouraged to come to staff with concerns and/or questions. Patient's medication routine continued. Patient's orders and plan of care reviewed.   R: Patient remains appropriate and cooperative. Will continue to monitor patient q15 minutes for safety.

## 2017-04-20 NOTE — Progress Notes (Signed)
[facility] MD Progress Note  04/20/2017 12:48 PM Shirlean SchleinCrystal Ferron  MRN:  [id number] Subjective:   41 y.o Caucasian female. Background history of SUD and mood disorder. [facility] via emergency services. She was committed involuntarily by her daughter. She had overdosed on thirty pills of Benadryl. Daughter reported that she has been expressing suicidal thoughts for over a week. She had expressed hearing voices in her head. At the ER she was noted to be confused. UDS was positive for amphetamine, cocaine, benzodiazepine and oxycodone.   Chart reviewed today. Patient discussed at team today. Her daughter called the unit yesterday expressing worries about the patient. Says patient's ex-boyfriend passed about this time two years ago. Concerned that the patient has been yearning to be with him.  Staff reports that she has been appropriate. She has not voiced any internal stimulation lately. She has been taking her medications as prescribed.  Seen today. Patient says she is feeling much better. Says the hallucinations has resolved completely. Says she is able to think clearly now. Tells me that she was thinking of checking herself in before her daughter committed her. Denies being suicidal when she overdosed. Says effects of the drugs in her system and the hallucinations made her act that way. Patient denies any yearning to be with her late boyfriend.  She denies any thoughts of violence.  We explored the effects of illicit substances. Role of substances on her cognition and perception was discussed. Patient is very dismissive of her addiction. Says she would stay away via will power. She declined any form of chemical dependency treatment. Says she feels good on her current regimen.  Principal Problem: MDD (major depressive disorder), recurrent severe, without psychosis (HCC) Diagnosis:   Patient Active Problem List   Diagnosis Date Noted  . MDD (major depressive disorder), recurrent severe, without  psychosis (HCC) [F33.2] 04/16/2017  . Polysubstance abuse (HCC) [F19.10] 04/16/2017  . 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 MDD (major depressive disorder) [F32.9] 04/15/2017  . Major depressive disorder, recurrent episode (HCC) [F33.9] 11/02/2016  . Drug overdose [T50.901A] 05/22/2016  . Major depression, recurrent (HCC) [F33.9] 05/21/2016  . Seizures (HCC) [R56.9]   . Headache [R51] 04/29/2014  . Seizure (HCC) [R56.9] 04/17/2014  . Smoking [F17.200] 04/17/2014  . Nausea and vomiting [R11.2] 04/17/2014  . Metabolic acidosis [E87.2] 04/17/2014  . Leukocytosis [D72.829] 04/17/2014  . Hypokalemia [E87.6] 04/17/2014   Total Time spent with patient: 30 minutes  Past Psychiatric History: As in H&P  Past Medical History:  Past Medical History:  Diagnosis Date  . Headache 04/29/2014  . Seizures (HCC)     Past Surgical History:  Procedure Laterality Date  . BACK SURGERY    . CHOLECYSTECTOMY    . TUBAL LIGATION     Family History:  Family History  Problem Relation Age of Onset  . Liver disease Mother   . Sarcoidosis Mother   . Emphysema Father   . Seizures Neg Hx    Family Psychiatric  History: As in H&P Social History:  Social History   Substance and Sexual Activity  Alcohol Use No  . Alcohol/week: 0.0 oz     Social History   Substance and Sexual Activity  Drug Use Yes  . Types: Oxycodone, Benzodiazepines   Comment: oxy 60mg  daily and xanax 2mg  daily    Social History   Socioeconomic History  . Marital status: Married    Spouse name: None  . Number of children: 2  .  Years of education: 7th grade  . Highest education level: None  Social Needs  . Financial resource strain: None  . Food insecurity - worry: None  . Food insecurity - inability: None  . Transportation needs - medical: None  . Transportation needs - non-medical: None  Occupational History  . None  Tobacco Use  . Smoking status: Current Every Day  Smoker    Packs/day: 1.00    Types: Cigarettes  . Smokeless tobacco: Never Used  Substance and Sexual Activity  . Alcohol use: No    Alcohol/week: 0.0 oz  . Drug use: Yes    Types: Oxycodone, Benzodiazepines    Comment: oxy 60mg  daily and xanax 2mg  daily  . Sexual activity: Not Currently  Other Topics Concern  . None  Social History Narrative   Patient is separated    Patient is right handed.   Patient drinks 3-4 sodas daily.   Additional Social History:    Pain Medications: denies Prescriptions: denies Over the Counter: buying pain pills off the street History of alcohol / drug use?: Yes Name of Substance 1: Pt tests positive for opiates, oxycodone, amphetamines, methamphetamines, benzos, and cocaine 1 - Age of First Use: Pt reports using pain pills for @ 2 years, cocaine for @ one year, and meth for a few months 1 - Frequency: "off and on" 1 - Duration: ongoing                  Sleep: Good  Appetite:  Good  Current Medications: Current Facility-Administered Medications  Medication Dose Route Frequency Provider Last Rate Last Dose  . acetaminophen (TYLENOL) tablet 650 mg  650 mg Oral Q6H PRN Rankin, Shuvon B, NP   650 mg at 04/19/17 1131  . alum & mag hydroxide-simeth (MAALOX/MYLANTA) 200-200-20 MG/5ML suspension 30 mL  30 mL Oral Q4H PRN Rankin, Shuvon B, NP      . citalopram (CELEXA) tablet 20 mg  20 mg Oral Daily Rankin, Shuvon B, NP   20 mg at 04/20/17 0834  . dicyclomine (BENTYL) tablet 20 mg  20 mg Oral Q6H PRN Nira Conn A, NP      . hydrOXYzine (ATARAX/VISTARIL) tablet 25 mg  25 mg Oral Q6H PRN Rankin, Shuvon B, NP   25 mg at 04/19/17 1130  . Influenza vac split quadrivalent PF (FLUARIX) injection 0.5 mL  0.5 mL Intramuscular Tomorrow-1000 Cobos, Fernando A, MD      . loperamide (IMODIUM) capsule 2-4 mg  2-4 mg Oral PRN Nira Conn A, NP      . magnesium hydroxide (MILK OF MAGNESIA) suspension 30 mL  30 mL Oral Daily PRN Rankin, Shuvon B, NP      .  methocarbamol (ROBAXIN) tablet 500 mg  500 mg Oral Q8H PRN Nira Conn A, NP   500 mg at 04/19/17 2033  . naproxen (NAPROSYN) tablet 500 mg  500 mg Oral BID PRN Jackelyn Poling, NP   500 mg at 04/19/17 2033  . nicotine (NICODERM CQ - dosed in mg/24 hours) patch 21 mg  21 mg Transdermal Daily Cobos, Rockey Situ, MD   21 mg at 04/20/17 0834  . ondansetron (ZOFRAN-ODT) disintegrating tablet 4 mg  4 mg Oral Q6H PRN Nira Conn A, NP      . traZODone (DESYREL) tablet 50 mg  50 mg Oral QHS Rankin, Shuvon B, NP   50 mg at 04/18/17 2218    Lab Results: No results found for this or any previous visit (from the  past 48 hour(s)).  Blood Alcohol level:  Lab Results  Component Value Date   [facility] 04/16/2014    Metabolic Disorder Labs: No results found for: HGBA1C, MPG No results found for: PROLACTIN No results found for: CHOL, TRIG, HDL, CHOLHDL, VLDL, LDLCALC  Physical Findings: AIMS: Facial and Oral Movements Muscles of Facial Expression: None, normal Lips and Perioral Area: None, normal Jaw: None, normal Tongue: None, normal,Extremity Movements Upper (arms, wrists, hands, fingers): None, normal Lower (legs, knees, ankles, toes): None, normal, Trunk Movements Neck, shoulders, hips: None, normal, Overall Severity Severity of abnormal movements (highest score from questions above): None, normal Incapacitation due to abnormal movements: None, normal Patient's awareness of abnormal movements (rate only patient's report): No Awareness, Dental Status Current problems with teeth and/or dentures?: No Does patient usually wear dentures?: No  CIWA:    COWS:  COWS Total Score: 5  Musculoskeletal: Strength & Muscle Tone: within normal limits Gait & Station: normal Patient leans: N/A  Psychiatric Specialty Exam: Physical Exam  Constitutional: She is oriented to person, place, and time. She appears well-developed and well-nourished.  HENT:  Head: Normocephalic and atraumatic.  Respiratory:  Effort normal.  Neurological: She is alert and oriented to person, place, and time.  Psychiatric:  As above    ROS  Blood pressure 113/75, pulse 63, temperature 98.2 F (36.8 C), temperature source Oral, resp. rate 18, height 5\' 4"  (1.626 m), weight 77.1 kg (170 lb).Body mass index is 29.18 kg/m.  General Appearance: Neatly dressed, calm and cooperative. Good relatedness. Appropriate behavior.   Eye Contact:  Good  Speech:  Clear and Coherent and Normal Rate  Volume:  Normal  Mood:  Euthymic  Affect:  Appropriate and Full Range  Thought Process:  Linear  Orientation:  Full (Time, Place, and Person)  Thought Content:  No delusional theme. No preoccupation with violent thoughts. No negative ruminations. No obsession.  No hallucination in any modality.   Suicidal Thoughts:  No  Homicidal Thoughts:  No  Memory:  Immediate;   Good Recent;   Good Remote;   Good  Judgement:  Good  Insight:  Good  Psychomotor Activity:  Normal  Concentration:  Concentration: Good and Attention Span: Good  Recall:  Good  Fund of Knowledge:  Good  Language:  Good  Akathisia:  Negative  Handed:    AIMS (if indicated):     Assets:  Communication Skills Housing Resilience Social Support  ADL's:  Intact  Cognition:  WNL  Sleep:  Number of Hours: 6.5     Treatment Plan Summary: Patient has come off psychoactive substances. She is no longer internally stimulated. She is not expressing any dangerousness. However her family remains concerned that she is very vulnerable this time of the year. We plan to have them visit her here and give us feedback. Hopeful discharge in a day or two.  Psychiatric: SIMD MDD  Medical: Seizure disorder  Psychosocial:   PLAN: 1. Continue medications at current dose 2. Family to visit and give us feedback 3. Motivational enhancement  4. Continue to monitor mood, behavior and interaction with peers.     Georgiann CockerVincent A Francile Woolford, MD 04/20/2017, 12:48 PM

## 2017-04-21 MED ORDER — HYDROXYZINE HCL 25 MG PO TABS: 25.0000 mg | ORAL_TABLET | Freq: Four times a day (QID) | ORAL | 0 refills | Status: AC | PRN

## 2017-04-21 MED ORDER — CITALOPRAM HYDROBROMIDE 20 MG PO TABS: 20.0000 mg | ORAL_TABLET | Freq: Every day | ORAL | 0 refills | Status: AC

## 2017-04-21 MED ORDER — TRAZODONE HCL 50 MG PO TABS: 50.0000 mg | ORAL_TABLET | Freq: Every evening | ORAL | 0 refills | Status: AC | PRN

## 2017-04-21 NOTE — Discharge Summary (Signed)
Physician Discharge Summary Note  Patient:  Martha Chen is an 41 y.o., female MRN:  [id number] DOB:  04/22/1976 Patient phone:  [phone] (home)  Patient address:   [address] [place] [id number],  Total Time spent with patient: 20 minutes  Date of Admission:  04/15/2017 Date of Discharge: 04/21/17   Reason for Admission:  Methamphetamine abuse  Principal Problem: MDD (major depressive disorder), recurrent severe, without psychosis [facility](HCC) Discharge Diagnoses: Patient Active Problem List   Diagnosis Date Noted  . MDD (major depressive disorder), recurrent severe, without psychosis (HCC) [F33.2] 04/16/2017  . Polysubstance abuse (HCC) [F19.10] 04/16/2017  . 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 MDD (major depressive disorder) [F32.9] 04/15/2017  . Major depressive disorder, recurrent episode (HCC) [F33.9] 11/02/2016  . Drug overdose [T50.901A] 05/22/2016  . Major depression, recurrent (HCC) [F33.9] 05/21/2016  . Seizures (HCC) [R56.9]   . Headache [R51] 04/29/2014  . Seizure (HCC) [R56.9] 04/17/2014  . Smoking [F17.200] 04/17/2014  . Nausea and vomiting [R11.2] 04/17/2014  . Metabolic acidosis [E87.2] 04/17/2014  . Leukocytosis [D72.829] 04/17/2014  . Hypokalemia [E87.6] 04/17/2014    Past Psychiatric History: Prior psychiatric hospitalization for Major Depression and Polysubstance abuse.  PCP prescribing Celexa   Past Medical History:  Past Medical History:  Diagnosis Date  . Headache 04/29/2014  . Seizures (HCC)     Past Surgical History:  Procedure Laterality Date  . BACK SURGERY    . CHOLECYSTECTOMY    . TUBAL LIGATION     Family History:  Family History  Problem Relation Age of Onset  . Liver disease Mother   . Sarcoidosis Mother   . Emphysema Father   . Seizures Neg Hx    Family Psychiatric  History: Denies Social History:  Social History   Substance and Sexual Activity  Alcohol Use No  .  Alcohol/week: 0.0 oz     Social History   Substance and Sexual Activity  Drug Use Yes  . Types: Oxycodone, Benzodiazepines   Comment: oxy 60mg  daily and xanax 2mg  daily    Social History   Socioeconomic History  . Marital status: Married    Spouse name: None  . Number of children: 2  . Years of education: 7th grade  . Highest education level: None  Social Needs  . Financial resource strain: None  . Food insecurity - worry: None  . Food insecurity - inability: None  . Transportation needs - medical: None  . Transportation needs - non-medical: None  Occupational History  . None  Tobacco Use  . Smoking status: Current Every Day Smoker    Packs/day: 1.00    Types: Cigarettes  . Smokeless tobacco: Never Used  Substance and Sexual Activity  . Alcohol use: No    Alcohol/week: 0.0 oz  . Drug use: Yes    Types: Oxycodone, Benzodiazepines    Comment: oxy 60mg  daily and xanax 2mg  daily  . Sexual activity: Not Currently  Other Topics Concern  . None  Social History Narrative   Patient is separated    Patient is right handed.   Patient drinks 3-4 sodas daily.    Hospital Course:   [facility]: 41 y.o., female patient seen face to face by this provider on 04/16/17.  Chart reviewed and discussed with treatment team.  On evaluation Martha Chen reports "I took a hand full of Benadryl.  I had been up for 7-8 days on meth binge.  I just needed some  sleep.  I told my daughter that I needed help.  Patient states that she has a chronic history of depression and polysubstance abuse.  At this time patient denies suicidal/homicidal ideation, psychosis, and paranoia. She was diagnosed with MDD, discharged on Celexa. She was admitted under commitment generated by her daughter, reporting she had been hallucinating and making suicidal statements . Patient states " I was doing OK up to about a month ago" . States that she " started to associate with certain people and got into  methamphetamine". Acknowledges that while using methamphetamine she was hearing voices and " feeling crazy". Reports recent overdose on Benadryl, but states intent was not suicidal but to " get some sleep". At this time states she feels better, and denies any suicidal ideations today. States she likes Celexa and feels it works, had stopped taking it about one month ago. At this time denies Suicidal ideations  Patient remained on the [facility] unit for 5 days and stabilized with medication and therapy. Patient was restarted on Celexa 20 mg Daily and used Vistaril PRN for anxiety and Trazodone PRN for insomnia. Patient also stabilized by sobering from the Methamphetamine abuse. Patient showed improvement with improved mood, affect, interaction, sleep, and appetite. Patient was seen in the day room interacting with peers and staff appropriately. Patient attended groups and participated. Patient's daughter was contacted for collateral information and the daughter agreed that patient was safe to come home. Patient will be discharged home with daughter. Patient agrees to follow up with Jolene Provost LCSW and [facility] after discharge. Patient is [provied with prescriptions for her medications upon discharge.       Physical Findings: AIMS: Facial and Oral Movements Muscles of Facial Expression: None, normal Lips and Perioral Area: None, normal Jaw: None, normal Tongue: None, normal,Extremity Movements Upper (arms, wrists, hands, fingers): None, normal Lower (legs, knees, ankles, toes): None, normal, Trunk Movements Neck, shoulders, hips: None, normal, Overall Severity Severity of abnormal movements (highest score from questions above): None, normal Incapacitation due to abnormal movements: None, normal Patient's awareness of abnormal movements (rate only patient's report): No Awareness, Dental Status Current problems with teeth and/or dentures?: No Does patient usually wear dentures?: No  CIWA:     COWS:  COWS Total Score: 5  Musculoskeletal: Strength & Muscle Tone: within normal limits Gait & Station: normal Patient leans: N/A  Psychiatric Specialty Exam: Physical Exam  Nursing note and vitals reviewed. Constitutional: She is oriented to person, place, and time. She appears well-developed and well-nourished.  Respiratory: Effort normal.  Musculoskeletal: Normal range of motion.  Neurological: She is alert and oriented to person, place, and time.  Skin: Skin is warm.    Review of Systems  Constitutional: Negative.   HENT: Negative.   Eyes: Negative.   Respiratory: Negative.   Cardiovascular: Negative.   Gastrointestinal: Negative.   Genitourinary: Negative.   Musculoskeletal: Negative.   Skin: Negative.   Neurological: Negative.   Endo/Heme/Allergies: Negative.   Psychiatric/Behavioral: Negative.     Blood pressure 137/65, pulse (!) 58, temperature 97.6 F (36.4 C), temperature source Oral, resp. rate 16, height 5\' 4"  (1.626 m), weight 77.1 kg (170 lb).Body mass index is 29.18 kg/m.  General Appearance: Casual  Eye Contact:  Good  Speech:  Clear and Coherent and Normal Rate  Volume:  Normal  Mood:  Euthymic  Affect:  Appropriate  Thought Process:  Goal Directed and Descriptions of Associations: Intact  Orientation:  Full (Time, Place, and Person)  Thought Content:  WDL  Suicidal Thoughts:  No  Homicidal Thoughts:  No  Memory:  Immediate;   Good Recent;   Good Remote;   Good  Judgement:  Good  Insight:  Good  Psychomotor Activity:  Normal  Concentration:  Concentration: Good and Attention Span: Good  Recall:  Good  Fund of Knowledge:  Good  Language:  Good  Akathisia:  No  Handed:  Right  AIMS (if indicated):     Assets:  Communication Skills Desire for Improvement Financial Resources/Insurance Housing Physical Health Social Support Transportation  ADL's:  Intact  Cognition:  WNL  Sleep:  Number of Hours: 6.5     Have you used any form of  tobacco in the last 30 days? (Cigarettes, Smokeless Tobacco, Cigars, and/or Pipes): Yes  Has this patient used any form of tobacco in the last 30 days? (Cigarettes, Smokeless Tobacco, Cigars, and/or Pipes) Yes, Yes, A prescription for an FDA-approved tobacco cessation medication was offered at discharge and the patient refused  Blood Alcohol level:  Lab Results  Component Value Date   [facility] <11 04/16/2014    Metabolic Disorder Labs:  No results found for: HGBA1C, MPG No results found for: PROLACTIN No results found for: CHOL, TRIG, HDL, CHOLHDL, VLDL, LDLCALC  See Psychiatric Specialty Exam and Suicide Risk Assessment completed by Attending Physician prior to discharge.  Discharge destination:  Home  Is patient on multiple antipsychotic therapies at discharge:  No   Has Patient had three or more failed trials of antipsychotic monotherapy by history:  No  Recommended Plan for Multiple Antipsychotic Therapies: NA   Allergies as of 04/21/2017   No Known Allergies     Medication List    STOP taking these medications   nicotine 21 mg/24hr patch Commonly known as:  NICODERM CQ - dosed in mg/24 hours     TAKE these medications     Indication  citalopram 20 MG tablet Commonly known as:  CELEXA Take 1 tablet (20 mg total) by mouth daily. For mood control Start taking on:  04/22/2017 What changed:  additional instructions  Indication:  Depression   hydrOXYzine 25 MG tablet Commonly known as:  ATARAX/VISTARIL Take 1 tablet (25 mg total) by mouth every 6 (six) hours as needed for anxiety.  Indication:  Anxiety Neurosis (Inactive)   traZODone 50 MG tablet Commonly known as:  DESYREL Take 1 tablet (50 mg total) by mouth at bedtime as needed for sleep. What changed:    when to take this  reasons to take this  additional instructions  Indication:  Trouble Sleeping      Follow-up Information    Audery Amel Follow up on 04/25/2017.   Why:  Appointment for therapy on  11/26 at 3 PM.  Please call to cancel/reschedule if needed Contact information: [address] [place], [place] [address] [phone] - phone [phone] - fax       [facility] Follow up on 04/26/2017.   Why:  Medication managment appointment 11/27 at 9:30am with Melissa. Please call the office if you need to cancel or reschedule your appointment. Thank you. Contact information: [address] [place] [address] [phone]           Follow-up recommendations:  Continue activity as tolerated. Continue diet as recommended by your PCP. Ensure to keep all appointments with outpatient providers.  Comments:  Patient is instructed prior to discharge to: Take all medications as prescribed by his/her mental healthcare provider. Report any adverse effects and or  reactions from the medicines to his/her outpatient provider promptly. Patient has been instructed & cautioned: To not engage in alcohol and or illegal drug use while on prescription medicines. In the event of worsening symptoms, patient is instructed to call the crisis hotline, 911 and or go to the nearest ED for appropriate evaluation and treatment of symptoms. To follow-up with his/her primary care provider for your other medical issues, concerns and or health care needs.    Signed: Gerlene Burdockravis B Momoko Slezak, FNP 04/21/2017, 9:26 AM

## 2017-04-21 NOTE — Progress Notes (Signed)
Patient ID: Martha Chen, female   DOB: 01/14/1976, [age] y.o.   MRN: [id number]   D: Patient pleasant on approach tonight. Reports mood better than on admission and currently denies any active SI. Contracts on the unit. Patient interacting with other peers. No agitation noted.  A: Staff will continue to monitor on q 15 minute checks, follow-treatment plan, and give medications as prescribed.  R: Cooperative on the unit.

## 2017-04-21 NOTE — Plan of Care (Signed)
  Activity: Interest or engagement in activities will improve 04/21/2017 1033 - Completed/Met by Joice Lofts, RN   Activity: Sleeping patterns will improve 04/21/2017 1033 - Completed/Met by Joice Lofts, RN   Education: Knowledge of [facility] Education information/materials will improve 04/21/2017 1033 - Completed/Met by Joice Lofts, RN   Education: Emotional status will improve 04/21/2017 1033 - Completed/Met by Joice Lofts, RN   Education: Mental status will improve 04/21/2017 1033 - Completed/Met by Joice Lofts, RN   Education: Verbalization of understanding the information provided will improve 04/21/2017 1033 - Completed/Met by Joice Lofts, RN   Coping: Ability to verbalize frustrations and anger appropriately will improve 04/21/2017 1033 - Completed/Met by Joice Lofts, RN   Coping: Ability to demonstrate self-control will improve 04/21/2017 1033 - Completed/Met by Joice Lofts, RN   Health Behavior/Discharge Planning: Identification of resources available to assist in meeting health care needs will improve 04/21/2017 1033 - Completed/Met by Joice Lofts, [place] Behavior/Discharge Planning: Compliance with treatment plan for underlying cause of condition will improve 04/21/2017 1033 - Completed/Met by Joice Lofts, RN   Physical Regulation: Ability to maintain clinical measurements within normal limits will improve 04/21/2017 1033 - Completed/Met by Joice Lofts, RN   Safety: Periods of time without injury will increase 04/21/2017 1033 - Completed/Met by Joice Lofts, RN   Education: Ability to make informed decisions regarding treatment will improve 04/21/2017 1033 - Completed/Met by Joice Lofts, RN   Coping: Ability to cope will improve 04/21/2017 1033 - Completed/Met by Joice Lofts, RN   Medication: Compliance with prescribed  medication regimen will improve 04/21/2017 1033 - Completed/Met by Joice Lofts, RN   Education: Knowledge of disease or condition will improve 04/21/2017 1033 - Completed/Met by Joice Lofts, RN   Education: Understanding of discharge needs will improve 04/21/2017 1033 - Completed/Met by Joice Lofts, RN   Physical Regulation: Complications related to the disease process, condition or treatment will be avoided or minimized 04/21/2017 1033 - Completed/Met by Joice Lofts, RN   Safety: Ability to remain free from injury will improve 04/21/2017 1033 - Completed/Met by Joice Lofts, RN   Activity: Interest or engagement in leisure activities will improve 04/21/2017 1033 - Completed/Met by Joice Lofts, RN   Activity: Imbalance in normal sleep/wake cycle will improve 04/21/2017 1033 - Completed/Met by Joice Lofts, [place] Behavior/Discharge Planning: Ability to make decisions will improve 04/21/2017 1033 - Completed/Met by Joice Lofts, [place] Behavior/Discharge Planning: Compliance with therapeutic regimen will improve 04/21/2017 1033 - Completed/Met by Joice Lofts, RN   Role Relationship: Ability to demonstrate positive changes in social behaviors and relationships will improve 04/21/2017 1033 - Completed/Met by Joice Lofts, RN   Spiritual Needs Ability to function at adequate level 04/21/2017 1033 - Completed/Met by Joice Lofts, RN

## 2017-04-21 NOTE — Progress Notes (Signed)
Discharge note:  Patient discharged home per MD order.  Patient received all personal belongings from unit and locker.  She received prescriptions of her medications.  She denies any thoughts of self harm.  Patient rates her depression as a 2; denies any hopelessness and anxiety.  Her goal is to "go home."  Reviewed AVS/transition record with patient and she indicated understanding.  Patient left ambulatory with her daughter.

## 2017-04-21 NOTE — [facility] Suicide Risk Assessment (Signed)
[facility]   Principal Problem: MDD (major depressive disorder), recurrent severe, without psychosis (HCC) Discharge Diagnoses:  Patient Active Problem List   Diagnosis Date Noted  . MDD (major depressive disorder), recurrent severe, without psychosis (HCC) [F33.2] 04/16/2017  . Polysubstance abuse (HCC) [F19.10] 04/16/2017  . 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 MDD (major depressive disorder) [F32.9] 04/15/2017  . Major depressive disorder, recurrent episode (HCC) [F33.9] 11/02/2016  . Drug overdose [T50.901A] 05/22/2016  . Major depression, recurrent (HCC) [F33.9] 05/21/2016  . Seizures (HCC) [R56.9]   . Headache [R51] 04/29/2014  . Seizure (HCC) [R56.9] 04/17/2014  . Smoking [F17.200] 04/17/2014  . Nausea and vomiting [R11.2] 04/17/2014  . Metabolic acidosis [E87.2] 04/17/2014  . Leukocytosis [D72.829] 04/17/2014  . Hypokalemia [E87.6] 04/17/2014    Total Time spent with patient: 30 minutes  Musculoskeletal: Strength & Muscle Tone: within normal limits Gait & Station: normal Patient leans: N/A  Psychiatric Specialty Exam: Review of Systems  Constitutional: Negative.   HENT: Negative.   Eyes: Negative.   Respiratory: Negative.   Cardiovascular: Negative.   Gastrointestinal: Negative.   Genitourinary: Negative.   Musculoskeletal: Negative.   Skin: Negative.   Neurological: Negative.   Endo/Heme/Allergies: Negative.   Psychiatric/Behavioral: Negative for depression, hallucinations, memory loss, substance abuse and suicidal ideas. The patient is not nervous/anxious and does not have insomnia.     Blood pressure 137/65, pulse (!) 58, temperature 97.6 F (36.4 C), temperature source Oral, resp. rate 16, height 5\' 4"  (1.626 m), weight 77.1 kg (170 lb).Body mass index is 29.18 kg/m.  General Appearance: Neatly dressed, pleasant, engaging well and cooperative. Appropriate behavior. Not in any  distress. Good relatedness. Not internally stimulated  Eye Contact::  Good  Speech:  Spontaneous, normal prosody. Normal tone and rate.   Volume:  Normal  Mood:  Euthymic  Affect:  Appropriate and Full Range  Thought Process:  Goal Directed  Orientation:  Full (Time, Place, and Person)  Thought Content:  No delusional theme. No preoccupation with violent thoughts. No negative ruminations. No obsession.  No hallucination in any modality.   Suicidal Thoughts:  No  Homicidal Thoughts:  No  Memory:  Immediate;   Good Recent;   Good Remote;   Good  Judgement:  Good  Insight:  Good  Psychomotor Activity:  Normal  Concentration:  Good  Recall:  Good  Fund of Knowledge:Good  Language: Good  Akathisia:  Negative  Handed:    AIMS (if indicated):     Assets:  Manufacturing systems engineerCommunication Skills Housing Physical Health Resilience Social Support  Sleep:  Number of Hours: 6.5  Cognition: WNL  ADL's:  Intact   Clinical Assessment::   41 y.o Caucasian female. Background history of SUD and mood disorder. [facility]. She was committed involuntarily by her daughter. She had overdosed on thirty pills of Benadryl. Daughter reported that she has been expressing suicidal thoughts for over a week. She had expressed hearing voices in her head. At the ER she was noted to be confused. UDS was positive for amphetamine, cocaine, benzodiazepine and oxycodone.   Seen today. Reports that she is in good spirits. Not feeling depressed. Reports normal energy and interest. Has been maintaining normal biological functions. She is able to think clearly. She is able to focus on task. Her thoughts are not crowded or racing. No evidence of mania. No hallucination in any modality. She is not making any delusional statement. No passivity  of will/thought. She is fully in touch with reality. No thoughts of suicide. No thoughts of homicide. No violent thoughts. No overwhelming anxiety. No access to  weapons. No craving for substances at this time.   Feliz Beamravis Money spoke to her daughter. They have been visiting her. They note that she is back to her normal self. No concerns about her hurting herself. Family agrees that addiction is a major player in her mental health. Her daughter would pick her up today.   Nursing staff reports that patient has been appropriate on the unit. Patient has been interacting well with peers. No behavioral issues. Patient has not voiced any suicidal thoughts. Patient has not been observed to be internally stimulated. Patient has been adherent with treatment recommendations. Patient has been tolerating their medication well.   Patient was discussed at team. Team members feels that patient is back to her baseline level of function. Team agrees with plan to discharge patient today.   Demographic Factors:  NA  Loss Factors: NA  Historical Factors: Impulsivity  Risk Reduction Factors:   Sense of responsibility to family, Living with another person, especially a relative, Positive social support, Positive therapeutic relationship and Positive coping skills or problem solving skills  Continued Clinical Symptoms:   as above   Cognitive Features That Contribute To Risk:  None    Suicide Risk:  Minimal: No identifiable suicidal ideation. Patient is not having any thoughts of suicide at this time. Modifiable risk factors targeted during this admission includes depression  and substance use. Demographical and historical risk factors cannot be modified. Patient is now engaging well. Patient is reliable and is future oriented. We have buffered patient's support structures. At this point, patient is at low risk of suicide. Patient is aware of the effects of psychoactive substances on decision making process. Patient has been provided with emergency contacts. Patient acknowledges to use resources provided if unforseen circumstances changes their current risk stratification.     Follow-up Information    Audery AmelMarc Lewis,LCSW Follow up on 04/25/2017.   Why:  Appointment for therapy on 11/26 at 3 PM.  Please call to cancel/reschedule if needed Contact information: [address] [place], [place] [address] [phone] phone [phone] fax       [facility] Follow up on 04/26/2017.   Why:  Medication managment appointment 11/27 at 9:30am with Melissa. Please call the office if you need to cancel or reschedule your appointment. Thank you. Contact information: [address] [place] [address] [phone]           Plan Of Care/Follow-up recommendations:  1. Continue current psychotropic medications 2. Mental health and addiction follow up as arranged.  3. Discharge in care of her family 4. Provided limited quantity of prescriptions   Georgiann CockerVincent A Izediuno, MD 04/21/2017, 9:31 AM

## 2017-04-22 NOTE — Progress Notes (Signed)
  [facility] :  Will you be returning to the same living situation after discharge:  Yes,  return home w family At discharge, do you have transportation home?: Yes,  daughter Do you have the ability to pay for your medications: Yes,  insured, Medicaid  Release of information consent forms completed and in the chart;  Patient's signature needed at discharge.  Patient to Follow up at: Follow-up Information    Audery AmelMarc Lewis,LCSW Follow up on 04/25/2017.   Why:  Appointment for therapy on 11/26 at 3 PM.  Please call to cancel/reschedule if needed Contact information: [address] [place], [place] [address] [phone] phone [phone] fax       [facility] Follow up on 04/26/2017.   Why:  Medication managment appointment 11/27 at 9:30am with Melissa. Please call the office if you need to cancel or reschedule your appointment. Thank you. Contact information: [address] [place] [address] [phone]           Next level of care provider has [facility] Link:no  Safety Planning and Suicide Prevention discussed: Yes,  w daughter  Have you used any form of tobacco in the last 30 days? (Cigarettes, Smokeless Tobacco, Cigars, and/or Pipes): Yes  Has patient been referred to the Quitline?: Patient refused referral  Patient has been referred for addiction treatment: Pt. refused referral, wants to follow up w current outpatient providers  Sallee LangeAnne C Cunningham, LCSW 04/22/2017, 9:54 AM
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
